# Patient Record
Sex: Female | Born: 1995
Health system: Southern US, Community
[De-identification: ages and names within clinical notes are randomized; demographics above are authoritative.]

## PROBLEM LIST (undated history)

## (undated) DIAGNOSIS — J45909 Unspecified asthma, uncomplicated: Secondary | ICD-10-CM

## (undated) HISTORY — PX: NO PAST SURGERIES: SHX2092

## (undated) HISTORY — DX: Unspecified asthma, uncomplicated: J45.909

---
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
## 2017-09-24 ENCOUNTER — Encounter: Payer: Self-pay | Admitting: Obstetrics & Gynecology

## 2017-09-24 ENCOUNTER — Ambulatory Visit (INDEPENDENT_AMBULATORY_CARE_PROVIDER_SITE_OTHER): Payer: Self-pay | Admitting: Obstetrics & Gynecology

## 2017-09-24 VITALS — BP 110/70 | Ht 63.0 in | Wt 153.0 lb

## 2017-09-24 DIAGNOSIS — N926 Irregular menstruation, unspecified: Secondary | ICD-10-CM

## 2017-09-24 DIAGNOSIS — Z3201 Encounter for pregnancy test, result positive: Secondary | ICD-10-CM

## 2017-09-24 LAB — POCT URINE PREGNANCY: Preg Test, Ur: POSITIVE — AB

## 2017-09-24 NOTE — Progress Notes (Signed)
09/24/2017   Chief Complaint: Missed period  Transfer of Care Patient: no  History of Present Illness: [name] is a 22 y.o. G1P0 5955w2d based on Patient's last menstrual period was 08/11/2017. with an Estimated Date of Delivery: 05/18/18, with the above CC.   Her periods were: regular periods every 28 days She was using oral contraceptives (estrogen/progesterone) when she conceived.  She has Positive signs or symptoms of nausea/vomiting of pregnancy. She has Negative signs or symptoms of miscarriage or preterm labor She identifies Negative Zika risk factors for her and her partner On any different medications around the time she conceived/early pregnancy: Yes  History of varicella: Yes   ROS: A 12-point review of systems was performed and negative, except as stated in the above HPI.  OBGYN History: As per HPI. OB History  Gravida Para Term Preterm AB Living  1            SAB TAB Ectopic Multiple Live Births               # Outcome Date GA Lbr Len/2nd Weight Sex Delivery Anes PTL Lv  1 Current             Any issues with any prior pregnancies: not applicable Any prior children are healthy, doing well, without any problems or issues: not applicable History of pap smears: No. Last pap smear Never. Abnormal: not applicable History of STIs: No   Past Medical History: History reviewed. No pertinent past medical history.  Past Surgical History: History reviewed. No pertinent surgical history.  Family History:  Family History  Problem Relation Age of Onset  . Colon cancer Paternal Aunt    She denies any female cancers, bleeding or blood clotting disorders.  She denies any history of mental retardation, birth defects or genetic disorders in her or the FOB's history  Social History:  Social History   Socioeconomic History  . Marital status: Unknown    Spouse name: Not on file  . Number of children: Not on file  . Years of education: Not on file  . Highest education level:  Not on file  Occupational History  . Not on file  Social Needs  . Financial resource strain: Not on file  . Food insecurity:    Worry: Not on file    Inability: Not on file  . Transportation needs:    Medical: Not on file    Non-medical: Not on file  Tobacco Use  . Smoking status: Never Smoker  . Smokeless tobacco: Never Used  Substance and Sexual Activity  . Alcohol use: Never    Frequency: Never  . Drug use: Never  . Sexual activity: Yes  Lifestyle  . Physical activity:    Days per week: Not on file    Minutes per session: Not on file  . Stress: Not on file  Relationships  . Social connections:    Talks on phone: Not on file    Gets together: Not on file    Attends religious service: Not on file    Active member of club or organization: Not on file    Attends meetings of clubs or organizations: Not on file    Relationship status: Not on file  . Intimate partner violence:    Fear of current or ex partner: Not on file    Emotionally abused: Not on file    Physically abused: Not on file    Forced sexual activity: Not on file  Other Topics Concern  .  Not on file  Social History Narrative  . Not on file   Any pets in the household: no  Allergy: Allergies  Allergen Reactions  . Amoxicillin Hives    Current Outpatient Medications: No current outpatient medications on file.   Physical Exam:   BP 110/70   Ht 5\' 3"  (1.6 m)   Wt 153 lb (69.4 kg)   LMP 08/11/2017   BMI 27.10 kg/m  Body mass index is 27.1 kg/m. Constitutional: Well nourished, well developed female in no acute distress.  Neck:  Supple, normal appearance, and no thyromegaly  Cardiovascular: S1, S2 normal, no murmur, rub or gallop, regular rate and rhythm Respiratory:  Clear to auscultation bilateral. Normal respiratory effort Abdomen: positive bowel sounds and no masses, hernias; diffusely non tender to palpation, non distended Breasts: breasts appear normal, no suspicious masses, no skin or  nipple changes or axillary nodes. Neuro/Psych:  Normal mood and affect.  Skin:  Warm and dry.  Lymphatic:  No inguinal lymphadenopathy.   Pelvic exam: is not limited by body habitus EGBUS: within normal limits, Vagina: within normal limits and with no blood in the vault, Cervix: normal appearing cervix without discharge or lesions, closed/long/high, Uterus:  enlarged: 6 weeks, and Adnexa:  normal adnexa  Assessment: Madison Lee is a 22 y.o. G1P0 [redacted]w[redacted]d based on Patient's last menstrual period was 08/11/2017. with an Estimated Date of Delivery: 05/18/18,  for prenatal care.  Plan:  1) Avoid alcoholic beverages. 2) Patient encouraged not to smoke.  3) Discontinue the use of all non-medicinal drugs and chemicals.  4) Take prenatal vitamins daily.  5) Seatbelt use advised 6) Nutrition, food safety (fish, cheese advisories, and high nitrite foods) and exercise discussed. 7) Hospital and practice style delivering at [facility] discussed  8) Patient is asked about travel to areas at risk for the Zika virus, and counseled to avoid travel and exposure to mosquitoes or sexual partners who may have themselves been exposed to the virus. Testing is discussed, and will be ordered as appropriate.  9) Childbirth classes at [facility] advised 10) Genetic Screening, such as with 1st Trimester Screening, cell free fetal DNA, AFP testing, and Ultrasound, as well as with amniocentesis and CVS as appropriate, is discussed with patient. She plans to consider options of genetic testing this pregnancy. 11) Dating [place] 2 weeks  Problem list reviewed and updated.  Annamarie Major, MD, [facility], [facility] 09/24/2017  4:20 PM

## 2017-09-24 NOTE — Patient Instructions (Signed)
First Trimester of Pregnancy The first trimester of pregnancy is from week 1 until the end of week 13 (months 1 through 3). A week after a sperm fertilizes an egg, the egg will implant on the wall of the uterus. This embryo will begin to develop into a baby. Genes from you and your partner will form the baby. The female genes will determine whether the baby will be a boy or a girl. At 6-8 weeks, the eyes and face will be formed, and the heartbeat can be seen on ultrasound. At the end of 12 weeks, all the baby's organs will be formed. Now that you are pregnant, you will want to do everything you can to have a healthy baby. Two of the most important things are to get good prenatal care and to follow your health care provider's instructions. Prenatal care is all the medical care you receive before the baby's birth. This care will help prevent, find, and treat any problems during the pregnancy and childbirth. Body changes during your first trimester Your body goes through many changes during pregnancy. The changes vary from woman to woman.  You may gain or lose a couple of pounds at first.  You may feel sick to your stomach (nauseous) and you may throw up (vomit). If the vomiting is uncontrollable, call your health care provider.  You may tire easily.  You may develop headaches that can be relieved by medicines. All medicines should be approved by your health care provider.  You may urinate more often. Painful urination may mean you have a bladder infection.  You may develop heartburn as a result of your pregnancy.  You may develop constipation because certain hormones are causing the muscles that push stool through your intestines to slow down.  You may develop hemorrhoids or swollen veins (varicose veins).  Your breasts may begin to grow larger and become tender. Your nipples may stick out more, and the tissue that surrounds them (areola) may become darker.  Your gums may bleed and may be  sensitive to brushing and flossing.  Dark spots or blotches (chloasma, mask of pregnancy) may develop on your face. This will likely fade after the baby is born.  Your menstrual periods will stop.  You may have a loss of appetite.  You may develop cravings for certain kinds of food.  You may have changes in your emotions from day to day, such as being excited to be pregnant or being concerned that something may go wrong with the pregnancy and baby.  You may have more vivid and strange dreams.  You may have changes in your hair. These can include thickening of your hair, rapid growth, and changes in texture. Some women also have hair loss during or after pregnancy, or hair that feels dry or thin. Your hair will most likely return to normal after your baby is born.  What to expect at prenatal visits During a routine prenatal visit:  You will be weighed to make sure you and the baby are growing normally.  Your blood pressure will be taken.  Your abdomen will be measured to track your baby's growth.  The fetal heartbeat will be listened to between weeks 10 and 14 of your pregnancy.  Test results from any previous visits will be discussed.  Your health care provider may ask you:  How you are feeling.  If you are feeling the baby move.  If you have had any abnormal symptoms, such as leaking fluid, bleeding, severe headaches,   or abdominal cramping.  If you are using any tobacco products, including cigarettes, chewing tobacco, and electronic cigarettes.  If you have any questions.  Other tests that may be performed during your first trimester include:  Blood tests to find your blood type and to check for the presence of any previous infections. The tests will also be used to check for low iron levels (anemia) and protein on red blood cells (Rh antibodies). Depending on your risk factors, or if you previously had diabetes during pregnancy, you may have tests to check for high blood  sugar that affects pregnant women (gestational diabetes).  Urine tests to check for infections, diabetes, or protein in the urine.  An ultrasound to confirm the proper growth and development of the baby.  Fetal screens for spinal cord problems (spina bifida) and Down syndrome.  HIV (human immunodeficiency virus) testing. Routine prenatal testing includes screening for HIV, unless you choose not to have this test.  You may need other tests to make sure you and the baby are doing well.  Follow these instructions at home: Medicines  Follow your health care provider's instructions regarding medicine use. Specific medicines may be either safe or unsafe to take during pregnancy.  Take a prenatal vitamin that contains at least 600 micrograms (mcg) of folic acid.  If you develop constipation, try taking a stool softener if your health care provider approves. Eating and drinking  Eat a balanced diet that includes fresh fruits and vegetables, whole grains, good sources of protein such as meat, eggs, or tofu, and low-fat dairy. Your health care provider will help you determine the amount of weight gain that is right for you.  Avoid raw meat and uncooked cheese. These carry germs that can cause birth defects in the baby.  Eating four or five small meals rather than three large meals a day may help relieve nausea and vomiting. If you start to feel nauseous, eating a few soda crackers can be helpful. Drinking liquids between meals, instead of during meals, also seems to help ease nausea and vomiting.  Limit foods that are high in fat and processed sugars, such as fried and sweet foods.  To prevent constipation: ? Eat foods that are high in fiber, such as fresh fruits and vegetables, whole grains, and beans. ? Drink enough fluid to keep your urine clear or pale yellow. Activity  Exercise only as directed by your health care provider. Most women can continue their usual exercise routine during  pregnancy. Try to exercise for 30 minutes at least 5 days a week. Exercising will help you: ? Control your weight. ? Stay in shape. ? Be prepared for labor and delivery.  Experiencing pain or cramping in the lower abdomen or lower back is a good sign that you should stop exercising. Check with your health care provider before continuing with normal exercises.  Try to avoid standing for long periods of time. Move your legs often if you must stand in one place for a long time.  Avoid heavy lifting.  Wear low-heeled shoes and practice good posture.  You may continue to have sex unless your health care provider tells you not to. Relieving pain and discomfort  Wear a good support bra to relieve breast tenderness.  Take warm sitz baths to soothe any pain or discomfort caused by hemorrhoids. Use hemorrhoid cream if your health care provider approves.  Rest with your legs elevated if you have leg cramps or low back pain.  If you develop   varicose veins in your legs, wear support hose. Elevate your feet for 15 minutes, 3-4 times a day. Limit salt in your diet. Prenatal care  Schedule your prenatal visits by the twelfth week of pregnancy. They are usually scheduled monthly at first, then more often in the last 2 months before delivery.  Write down your questions. Take them to your prenatal visits.  Keep all your prenatal visits as told by your health care provider. This is important. Safety  Wear your seat belt at all times when driving.  Make a list of emergency phone numbers, including numbers for family, friends, the hospital, and police and fire departments. General instructions  Ask your health care provider for a referral to a local prenatal education class. Begin classes no later than the beginning of month 6 of your pregnancy.  Ask for help if you have counseling or nutritional needs during pregnancy. Your health care provider can offer advice or refer you to specialists for help  with various needs.  Do not use hot tubs, steam rooms, or saunas.  Do not douche or use tampons or scented sanitary pads.  Do not cross your legs for long periods of time.  Avoid cat litter boxes and soil used by cats. These carry germs that can cause birth defects in the baby and possibly loss of the fetus by miscarriage or stillbirth.  Avoid all smoking, herbs, alcohol, and medicines not prescribed by your health care provider. Chemicals in these products affect the formation and growth of the baby.  Do not use any products that contain nicotine or tobacco, such as cigarettes and e-cigarettes. If you need help quitting, ask your health care provider. You may receive counseling support and other resources to help you quit.  Schedule a dentist appointment. At home, brush your teeth with a soft toothbrush and be gentle when you floss. Contact a health care provider if:  You have dizziness.  You have mild pelvic cramps, pelvic pressure, or nagging pain in the abdominal area.  You have persistent nausea, vomiting, or diarrhea.  You have a bad smelling vaginal discharge.  You have pain when you urinate.  You notice increased swelling in your face, hands, legs, or ankles.  You are exposed to fifth disease or chickenpox.  You are exposed to German measles (rubella) and have never had it. Get help right away if:  You have a fever.  You are leaking fluid from your vagina.  You have spotting or bleeding from your vagina.  You have severe abdominal cramping or pain.  You have rapid weight gain or loss.  You vomit blood or material that looks like coffee grounds.  You develop a severe headache.  You have shortness of breath.  You have any kind of trauma, such as from a fall or a car accident. Summary  The first trimester of pregnancy is from week 1 until the end of week 13 (months 1 through 3).  Your body goes through many changes during pregnancy. The changes vary from  woman to woman.  You will have routine prenatal visits. During those visits, your health care provider will examine you, discuss any test results you may have, and talk with you about how you are feeling. This information is not intended to replace advice given to you by your health care provider. Make sure you discuss any questions you have with your health care provider. Document Released: 03/31/2001 Document Revised: 03/18/2016 Document Reviewed: 03/18/2016 Elsevier Interactive Patient Education  2018 [employer]vier   Inc.  

## 2017-09-25 LAB — RPR+RH+ABO+RUB AB+AB SCR+CB...
Antibody Screen: NEGATIVE
HEMATOCRIT: 38.7 % (ref 34.0–46.6)
HEP B S AG: NEGATIVE
HIV SCREEN 4TH GENERATION: NONREACTIVE
Hemoglobin: 12.6 g/dL (ref 11.1–15.9)
MCH: 28.7 pg (ref 26.6–33.0)
MCHC: 32.6 g/dL (ref 31.5–35.7)
MCV: 88 fL (ref 79–97)
Platelets: 266 10*3/uL (ref 150–450)
RBC: 4.39 x10E6/uL (ref 3.77–5.28)
RDW: 12.9 % (ref 12.3–15.4)
RPR: NONREACTIVE
RUBELLA: 3.25 {index} (ref >0.99)
Rh Factor: POSITIVE
Varicella zoster IgG: 218 index (ref >165)
WBC: 8.8 10*3/uL (ref 3.4–10.8)

## 2017-09-29 LAB — GC/CHLAMYDIA PROBE AMP
Chlamydia trachomatis, NAA: NEGATIVE
NEISSERIA GONORRHOEAE BY PCR: NEGATIVE

## 2017-09-29 LAB — DRUG SCREEN, URINE
Amphetamines, Urine: NEGATIVE ng/mL
BARBITURATE SCREEN URINE: NEGATIVE ng/mL
BENZODIAZEPINE QUANT UR: NEGATIVE ng/mL
CANNABINOID QUANT UR: NEGATIVE ng/mL
Cocaine (Metab.): NEGATIVE ng/mL
OPIATE QUANT UR: NEGATIVE ng/mL
PCP Quant, Ur: NEGATIVE ng/mL

## 2017-09-29 LAB — URINE CULTURE

## 2017-10-08 ENCOUNTER — Other Ambulatory Visit: Payer: BLUE CROSS/BLUE SHIELD

## 2017-10-08 ENCOUNTER — Encounter: Payer: BLUE CROSS/BLUE SHIELD | Admitting: Obstetrics & Gynecology

## 2017-10-18 ENCOUNTER — Other Ambulatory Visit: Payer: BLUE CROSS/BLUE SHIELD

## 2017-10-18 ENCOUNTER — Telehealth: Payer: Self-pay | Admitting: Obstetrics & Gynecology

## 2017-10-18 ENCOUNTER — Encounter: Payer: BLUE CROSS/BLUE SHIELD | Admitting: Advanced Practice Midwife

## 2017-10-18 DIAGNOSIS — N926 Irregular menstruation, unspecified: Secondary | ICD-10-CM

## 2017-10-18 NOTE — Telephone Encounter (Signed)
Done

## 2017-10-18 NOTE — Telephone Encounter (Signed)
Patient was schedule today for ultrasound. Due to having no ultrasound tech today we are having to reschedule her appointment to 10/26/17 with Ultrasound and ROB. The Ultrasound order runs out on 10/24/17. Would you please extend the ultrasound order please. Thank you

## 2017-10-26 ENCOUNTER — Ambulatory Visit (INDEPENDENT_AMBULATORY_CARE_PROVIDER_SITE_OTHER): Payer: BLUE CROSS/BLUE SHIELD | Admitting: Obstetrics and Gynecology

## 2017-10-26 ENCOUNTER — Encounter: Payer: Self-pay | Admitting: Obstetrics and Gynecology

## 2017-10-26 ENCOUNTER — Ambulatory Visit (INDEPENDENT_AMBULATORY_CARE_PROVIDER_SITE_OTHER): Payer: Medicaid Other

## 2017-10-26 VITALS — BP 124/74 | Wt 149.0 lb

## 2017-10-26 DIAGNOSIS — Z3401 Encounter for supervision of normal first pregnancy, first trimester: Secondary | ICD-10-CM

## 2017-10-26 DIAGNOSIS — Z3A10 10 weeks gestation of pregnancy: Secondary | ICD-10-CM

## 2017-10-26 DIAGNOSIS — Z3A11 11 weeks gestation of pregnancy: Secondary | ICD-10-CM | POA: Diagnosis not present

## 2017-10-26 DIAGNOSIS — N926 Irregular menstruation, unspecified: Secondary | ICD-10-CM

## 2017-10-26 DIAGNOSIS — O3680X0 Pregnancy with inconclusive fetal viability, not applicable or unspecified: Secondary | ICD-10-CM | POA: Diagnosis not present

## 2017-10-26 DIAGNOSIS — Z3400 Encounter for supervision of normal first pregnancy, unspecified trimester: Secondary | ICD-10-CM

## 2017-10-26 NOTE — Progress Notes (Signed)
ROB Dating scan 

## 2017-10-26 NOTE — Progress Notes (Signed)
    Routine Prenatal Care Visit  Subjective  Madison Lee is a 22 y.o. G1P0 at 9077w6d being seen today for ongoing prenatal care.  She is currently monitored for the following issues for this low-risk pregnancy and has Supervision of normal first pregnancy, antepartum on their problem list.  ----------------------------------------------------------------------------------- Patient reports no complaints.   Contractions: Not present. Vag. Bleeding: None.   . Denies leaking of fluid.  ----------------------------------------------------------------------------------- The following portions of the patient's history were reviewed and updated as appropriate: allergies, current medications, past family history, past medical history, past social history, past surgical history and problem list. Problem list updated.   Objective  Blood pressure 124/74, weight 149 lb (67.6 kg), last menstrual period 08/11/2017. Pregravid weight 150 lb (68 kg) Total Weight Gain -1 lb (-0.454 kg) Urinalysis:      Fetal Status: Fetal Heart Rate (bpm): 162         General:  Alert, oriented and cooperative. Patient is in no acute distress.  Skin: Skin is warm and dry. No rash noted.   Cardiovascular: Normal heart rate noted  Respiratory: Normal respiratory effort, no problems with respiration noted  Abdomen: Soft, gravid, appropriate for gestational age. Pain/Pressure: Absent     Pelvic:  Cervical exam deferred        Extremities: Normal range of motion.     ental Status: Normal mood and affect. Normal behavior. Normal judgment and thought content.     Assessment   22 y.o. G1P0 at 6177w6d by  05/18/2018, by Last Menstrual Period presenting for routine prenatal visit  Plan   [email or web address] Problems (from 09/10/17 to present)    No problems associated with this episode.       Gestational age appropriate obstetric precautions including but not limited to vaginal bleeding, contractions, leaking of fluid and fetal  movement were reviewed in detail with the patient.    Madison Lee is interested in the first trimester screen at this time. If her medicaid is approved she may decide to have Materniti21 testing.  Letter given regarding limiting RoundUp exposure during pregnancy.  Return in about 2 weeks (around 11/09/2017) for ROB and US.  Adelene Idlerhristanna Michelena Culmer MD [facility], [facility] 10/26/17 6:10 PM

## 2017-11-11 ENCOUNTER — Ambulatory Visit (INDEPENDENT_AMBULATORY_CARE_PROVIDER_SITE_OTHER): Payer: BLUE CROSS/BLUE SHIELD | Admitting: Advanced Practice Midwife

## 2017-11-11 ENCOUNTER — Ambulatory Visit (INDEPENDENT_AMBULATORY_CARE_PROVIDER_SITE_OTHER): Payer: BLUE CROSS/BLUE SHIELD

## 2017-11-11 VITALS — BP 116/72 | Wt 155.0 lb

## 2017-11-11 DIAGNOSIS — Z3401 Encounter for supervision of normal first pregnancy, first trimester: Secondary | ICD-10-CM

## 2017-11-11 DIAGNOSIS — Z1379 Encounter for other screening for genetic and chromosomal anomalies: Secondary | ICD-10-CM

## 2017-11-11 DIAGNOSIS — Z3400 Encounter for supervision of normal first pregnancy, unspecified trimester: Secondary | ICD-10-CM

## 2017-11-11 DIAGNOSIS — Z3A13 13 weeks gestation of pregnancy: Secondary | ICD-10-CM

## 2017-11-11 NOTE — Progress Notes (Signed)
ROB

## 2017-11-11 NOTE — Progress Notes (Signed)
  Routine Prenatal Care Visit  Subjective  Madison Lee is a 22 y.o. G1P0 at 437w1d being seen today for ongoing prenatal care.  She is currently monitored for the following issues for this low-risk pregnancy and has Supervision of normal first pregnancy, antepartum on their problem list.  ----------------------------------------------------------------------------------- Patient reports no complaints.  Discussion of prenatal care, food safety, labor questions.  . Vag. Bleeding: None.   . Denies leaking of fluid.  ----------------------------------------------------------------------------------- The following portions of the patient's history were reviewed and updated as appropriate: allergies, current medications, past family history, past medical history, past social history, past surgical history and problem list. Problem list updated.   Objective  Blood pressure 116/72, weight 155 lb (70.3 kg), last menstrual period 08/11/2017. Pregravid weight 150 lb (68 kg) Total Weight Gain 5 lb (2.268 kg) Urinalysis: Urine Protein: Negative Urine Glucose: Negative  Fetal Status: Fetal Heart Rate (bpm): 149         General:  Alert, oriented and cooperative. Patient is in no acute distress.  Skin: Skin is warm and dry. No rash noted.   Cardiovascular: Normal heart rate noted  Respiratory: Normal respiratory effort, no problems with respiration noted  Abdomen: Soft, gravid, appropriate for gestational age. Pain/Pressure: Absent     Pelvic:  Cervical exam deferred        Extremities: Normal range of motion.  Edema: None  Mental Status: Normal mood and affect. Normal behavior. Normal judgment and thought content.   Assessment   22 y.o. G1P0 at 807w1d by  05/18/2018, by Last Menstrual Period presenting for routine prenatal visit  Plan   pregnancy1 Problems (from 09/10/17 to present)    No problems associated with this episode.       Preterm labor symptoms and general obstetric precautions  including but not limited to vaginal bleeding, contractions, leaking of fluid and fetal movement were reviewed in detail with the patient. Please refer to After Visit Summary for other counseling recommendations.   Return in about 1 month (around 12/09/2017) for rob.  Tresea MallJane Dayvian Lee, CNM 11/11/2017 4:14 PM

## 2017-11-11 NOTE — Patient Instructions (Signed)
Prenatal Care WHAT IS PRENATAL CARE? Prenatal care is the process of caring for a pregnant woman before she gives birth. Prenatal care makes sure that she and her baby remain as healthy as possible throughout pregnancy. Prenatal care may be provided by a midwife, family practice health care provider, or a childbirth and pregnancy specialist (obstetrician). Prenatal care may include physical examinations, testing, treatments, and education on nutrition, lifestyle, and social support services. WHY IS PRENATAL CARE SO IMPORTANT? Early and consistent prenatal care increases the chance that you and your baby will remain healthy throughout your pregnancy. This type of care also decreases a baby's risk of being born too early (prematurely), or being born smaller than expected (small for gestational age). Any underlying medical conditions you may have that could pose a risk during your pregnancy are discussed during prenatal care visits. You will also be monitored regularly for any new conditions that may arise during your pregnancy so they can be treated quickly and effectively. WHAT HAPPENS DURING PRENATAL CARE VISITS? Prenatal care visits may include the following: Discussion Tell your health care provider about any new signs or symptoms you have experienced since your last visit. These might include:  Nausea or vomiting.  Increased or decreased level of energy.  Difficulty sleeping.  Back or leg pain.  Weight changes.  Frequent urination.  Shortness of breath with physical activity.  Changes in your skin, such as the development of a rash or itchiness.  Vaginal discharge or bleeding.  Feelings of excitement or nervousness.  Changes in your baby's movements.  You may want to write down any questions or topics you want to discuss with your health care provider and bring them with you to your appointment. Examination During your first prenatal care visit, you will likely have a complete  physical exam. Your health care provider will often examine your vagina, cervix, and the position of your uterus, as well as check your heart, lungs, and other body systems. As your pregnancy progresses, your health care provider will measure the size of your uterus and your baby's position inside your uterus. He or she may also examine you for early signs of labor. Your prenatal visits may also include checking your blood pressure and, after about 10-12 weeks of pregnancy, listening to your baby's heartbeat. Testing Regular testing often includes:  Urinalysis. This checks your urine for glucose, protein, or signs of infection.  Blood count. This checks the levels of white and red blood cells in your body.  Tests for sexually transmitted infections (STIs). Testing for STIs at the beginning of pregnancy is routinely done and is required in many states.  Antibody testing. You will be checked to see if you are immune to certain illnesses, such as rubella, that can affect a developing fetus.  Glucose screen. Around 24-28 weeks of pregnancy, your blood glucose level will be checked for signs of gestational diabetes. Follow-up tests may be recommended.  Group B strep. This is a bacteria that is commonly found inside a woman's vagina. This test will inform your health care provider if you need an antibiotic to reduce the amount of this bacteria in your body prior to labor and childbirth.  Ultrasound. Many pregnant women undergo an ultrasound screening around 18-20 weeks of pregnancy to evaluate the health of the fetus and check for any developmental abnormalities.  HIV (human immunodeficiency virus) testing. Early in your pregnancy, you will be screened for HIV. If you are at high risk for HIV, this test may   be repeated during your third trimester of pregnancy.  You may be offered other testing based on your age, personal or family medical history, or other factors. HOW OFTEN SHOULD I PLAN TO SEE MY  HEALTH CARE PROVIDER FOR PRENATAL CARE? Your prenatal care check-up schedule depends on any medical conditions you have before, or develop during, your pregnancy. If you do not have any underlying medical conditions, you will likely be seen for checkups:  Monthly, during the first 6 months of pregnancy.  Twice a month during months 7 and 8 of pregnancy.  Weekly starting in the 9th month of pregnancy and until delivery.  If you develop signs of early labor or other concerning signs or symptoms, you may need to see your health care provider more often. Ask your health care provider what prenatal care schedule is best for you. WHAT CAN I DO TO KEEP MYSELF AND MY BABY AS HEALTHY AS POSSIBLE DURING MY PREGNANCY?  Take a prenatal vitamin containing 400 micrograms (0.4 mg) of folic acid every day. Your health care provider may also ask you to take additional vitamins such as iodine, vitamin D, iron, copper, and zinc.  Take 1500-2000 mg of calcium daily starting at your 20th week of pregnancy until you deliver your baby.  Make sure you are up to date on your vaccinations. Unless directed otherwise by your health care provider: ? You should receive a tetanus, diphtheria, and pertussis (Tdap) vaccination between the 27th and 36th week of your pregnancy, regardless of when your last Tdap immunization occurred. This helps protect your baby from whooping cough (pertussis) after he or she is born. ? You should receive an annual inactivated influenza vaccine (IIV) to help protect you and your baby from influenza. This can be done at any point during your pregnancy.  Eat a well-rounded diet that includes: ? Fresh fruits and vegetables. ? Lean proteins. ? Calcium-rich foods such as milk, yogurt, hard cheeses, and dark, leafy greens. ? Whole grain breads.  Do noteat seafood high in mercury, including: ? Swordfish. ? Tilefish. ? Shark. ? King mackerel. ? More than 6 oz tuna per week.  Do not  eat: ? Raw or undercooked meats or eggs. ? Unpasteurized foods, such as soft cheeses (brie, blue, or feta), juices, and milks. ? Lunch meats. ? Hot dogs that have not been heated until they are steaming.  Drink enough water to keep your urine clear or pale yellow. For many women, this may be 10 or more 8 oz glasses of water each day. Keeping yourself hydrated helps deliver nutrients to your baby and may prevent the start of pre-term uterine contractions.  Do not use any tobacco products including cigarettes, chewing tobacco, or electronic cigarettes. If you need help quitting, ask your health care provider.  Do not drink beverages containing alcohol. No safe level of alcohol consumption during pregnancy has been determined.  Do not use any illegal drugs. These can harm your developing baby or cause a miscarriage.  Ask your health care provider or pharmacist before taking any prescription or over-the-counter medicines, herbs, or supplements.  Limit your caffeine intake to no more than 200 mg per day.  Exercise. Unless told otherwise by your health care provider, try to get 30 minutes of moderate exercise most days of the week. Do not  do high-impact activities, contact sports, or activities with a high risk of falling, such as horseback riding or downhill skiing.  Get plenty of rest.  Avoid anything that raises your  body temperature, such as hot tubs and saunas.  If you own a cat, do not empty its litter box. Bacteria contained in cat feces can cause an infection called toxoplasmosis. This can result in serious harm to the fetus.  Stay away from chemicals such as insecticides, lead, mercury, and cleaning or paint products that contain solvents.  Do not have any X-rays taken unless medically necessary.  Take a childbirth and breastfeeding preparation class. Ask your health care provider if you need a referral or recommendation.  This information is not intended to replace advice given  to you by your health care provider. Make sure you discuss any questions you have with your health care provider. Document Released: 04/09/2003 Document Revised: 09/09/2015 Document Reviewed: 06/21/2013 Elsevier Interactive Patient Education  2017 [employer] Eating Plan for Pregnant Women While you are pregnant, your body will require additional nutrition to help support your growing baby. It is recommended that you consume:  150 additional calories each day during your first trimester.  300 additional calories each day during your second trimester.  300 additional calories each day during your third trimester.  Eating a healthy, well-balanced diet is very important for your health and for your baby's health. You also have a higher need for some vitamins and minerals, such as folic acid, calcium, iron, and vitamin D. What do I need to know about eating during pregnancy?  Do not try to lose weight or go on a diet during pregnancy.  Choose healthy, nutritious foods. Choose  of a sandwich with a glass of milk instead of a candy bar or a high-calorie sugar-sweetened beverage.  Limit your overall intake of foods that have "empty calories." These are foods that have little nutritional value, such as sweets, desserts, candies, sugar-sweetened beverages, and fried foods.  Eat a variety of foods, especially fruits and vegetables.  Take a prenatal vitamin to help meet the additional needs during pregnancy, specifically for folic acid, iron, calcium, and vitamin D.  Remember to stay active. Ask your health care provider for exercise recommendations that are specific to you.  Practice good food safety and cleanliness, such as washing your hands before you eat and after you prepare raw meat. This helps to prevent foodborne illnesses, such as listeriosis, that can be very dangerous for your baby. Ask your health care provider for more information about listeriosis. What does 150 extra calories  look like? Healthy options for an additional 150 calories each day could be any of the following:  Plain low-fat yogurt (6-8 oz) with  cup of berries.  1 apple with 2 teaspoons of peanut butter.  Cut-up vegetables with  cup of hummus.  Low-fat chocolate milk (8 oz or 1 cup).  1 string cheese with 1 medium orange.   of a peanut butter and jelly sandwich on whole-wheat bread (1 tsp of peanut butter).  For 300 calories, you could eat two of those healthy options each day. What is a healthy amount of weight to gain? The recommended amount of weight for you to gain is based on your pre-pregnancy BMI. If your pre-pregnancy BMI was:  Less than 18 (underweight), you should gain 28-40 lb.  18-24.9 (normal), you should gain 25-35 lb.  25-29.9 (overweight), you should gain 15-25 lb.  Greater than 30 (obese), you should gain 11-20 lb.  What if I am having twins or multiples? Generally, pregnant women who will be having twins or multiples may need to increase their daily calories by 300-600 calories each   day. The recommended range for total weight gain is 25-54 lb, depending on your pre-pregnancy BMI. Talk with your health care provider for specific guidance about additional nutritional needs, weight gain, and exercise during your pregnancy. What foods can I eat? Grains Any grains. Try to choose whole grains, such as whole-wheat bread, oatmeal, or brown rice. Vegetables Any vegetables. Try to eat a variety of colors and types of vegetables to get a full range of vitamins and minerals. Remember to wash your vegetables well before eating. Fruits Any fruits. Try to eat a variety of colors and types of fruit to get a full range of vitamins and minerals. Remember to wash your fruits well before eating. Meats and Other Protein Sources Lean meats, including chicken, turkey, fish, and lean cuts of beef, veal, or pork. Make sure that all meats are cooked to "well done." Tofu. Tempeh. Beans. Eggs.  Peanut butter and other nut butters. Seafood, such as shrimp, crab, and lobster. If you choose fish, select types that are higher in omega-3 fatty acids, including salmon, herring, mussels, trout, sardines, and pollock. Make sure that all meats are cooked to food-safe temperatures. Dairy Pasteurized milk and milk alternatives. Pasteurized yogurt and pasteurized cheese. Cottage cheese. Sour cream. Beverages Water. Juices that contain 100% fruit juice or vegetable juice. Caffeine-free teas and decaffeinated coffee. Drinks that contain caffeine are okay to drink, but it is better to avoid caffeine. Keep your total caffeine intake to less than 200 mg each day (12 oz of coffee, tea, or soda) or as directed by your health care provider. Condiments Any pasteurized condiments. Sweets and Desserts Any sweets and desserts. Fats and Oils Any fats and oils. The items listed above may not be a complete list of recommended foods or beverages. Contact your dietitian for more options. What foods are not recommended? Vegetables Unpasteurized (raw) vegetable juices. Fruits Unpasteurized (raw) fruit juices. Meats and Other Protein Sources Cured meats that have nitrates, such as bacon, salami, and hotdogs. Luncheon meats, bologna, or other deli meats (unless they are reheated until they are steaming hot). Refrigerated pate, meat spreads from a meat counter, smoked seafood that is found in the refrigerated section of a store. Raw fish, such as sushi or sashimi. High mercury content fish, such as tilefish, shark, swordfish, and king mackerel. Raw meats, such as tuna or beef tartare. Undercooked meats and poultry. Make sure that all meats are cooked to food-safe temperatures. Dairy Unpasteurized (raw) milk and any foods that have raw milk in them. Soft cheeses, such as feta, queso blanco, queso fresco, Brie, Camembert cheeses, blue-veined cheeses, and Panela cheese (unless it is made with pasteurized milk, which must  be stated on the label). Beverages Alcohol. Sugar-sweetened beverages, such as sodas, teas, or energy drinks. Condiments Homemade fermented foods and drinks, such as pickles, sauerkraut, or kombucha drinks. (Store-bought pasteurized versions of these are okay.) Other Salads that are made in the store, such as ham salad, chicken salad, egg salad, tuna salad, and seafood salad. The items listed above may not be a complete list of foods and beverages to avoid. Contact your dietitian for more information. This information is not intended to replace advice given to you by your health care provider. Make sure you discuss any questions you have with your health care provider. Document Released: 01/19/2014 Document Revised: 09/12/2015 Document Reviewed: 09/19/2013 Elsevier Interactive Patient Education  2018 [employer] Inc[employer]  

## 2017-11-13 LAB — FIRST TRIMESTER SCREEN W/NT
CRL: 72.4 mm
DIA MoM: 1.28
DIA Value: 277.5 pg/mL
Gest Age-Collect: 13.1 weeks
HCG MOM: 0.83
HCG VALUE: 68.4 [IU]/mL
Maternal Age At EDD: 21.6 yr
NUCHAL TRANSLUCENCY MOM: 1.6
NUCHAL TRANSLUCENCY: 2.4 mm
Number of Fetuses: 1
PAPP-A MOM: 1.42
PAPP-A VALUE: 1580.2 ng/mL
Test Results:: NEGATIVE
WEIGHT: 155 [lb_av]

## 2017-11-15 ENCOUNTER — Encounter: Payer: Self-pay | Admitting: Medical

## 2017-11-15 ENCOUNTER — Ambulatory Visit: Payer: BLUE CROSS/BLUE SHIELD | Admitting: Medical

## 2017-11-15 VITALS — BP 124/68 | HR 86 | Temp 98.7°F | Resp 16 | Wt 147.6 lb

## 2017-11-15 DIAGNOSIS — B349 Viral infection, unspecified: Secondary | ICD-10-CM

## 2017-11-15 DIAGNOSIS — R0982 Postnasal drip: Secondary | ICD-10-CM

## 2017-11-15 MED ORDER — AZITHROMYCIN 250 MG PO TABS: ORAL_TABLET | ORAL | 0 refills | Status: DC

## 2017-11-15 NOTE — Patient Instructions (Addendum)
OTC Claritin take as directed.  Rest and increase fluids. Start anbiotics in 2 days if worsening.   Postnasal Drip Postnasal drip is the feeling of mucus going down the back of your throat. Mucus is a slimy substance that moistens and cleans your nose and throat, as well as the air pockets in face bones near your forehead and cheeks (sinuses). Small amounts of mucus pass from your nose and sinuses down the back of your throat all the time. This is normal. When you produce too much mucus or the mucus gets too thick, you can feel it. Some common causes of postnasal drip include:  Having more mucus because of: ? A cold or the flu. ? Allergies. ? Cold air. ? Certain medicines.  Having more mucus that is thicker because of: ? A sinus or nasal infection. ? Dry air. ? A food allergy.  Follow these instructions at home: Relieving discomfort  Gargle with a salt-water mixture 3-4 times a day or as needed. To make a salt-water mixture, completely dissolve -1 tsp of salt in 1 cup of warm water.  If the air in your home is dry, use a humidifier to add moisture to the air.  Use a saline spray or container (neti pot) to flush out the nose (nasal irrigation). These methods can help clear away mucus and keep the nasal passages moist. General instructions  Take over-the-counter and prescription medicines only as told by your health care provider.  Follow instructions from your health care provider about eating or drinking restrictions. You may need to avoid caffeine.  Avoid things that you know you are allergic to (allergens), like dust, mold, pollen, pets, or certain foods.  Drink enough fluid to keep your urine pale yellow.  Keep all follow-up visits as told by your health care provider. This is important. Contact a health care provider if:  You have a fever.  You have a sore throat.  You have difficulty swallowing.  You have headache.  You have sinus pain.  You have a cough that  does not go away.  The mucus from your nose becomes thick and is green or yellow in color.  You have cold or flu symptoms that last more than 10 days. Summary  Postnasal drip is the feeling of mucus going down the back of your throat.  If your health care provider approves, use nasal irrigation or a nasal spray 2?4 times a day.  Avoid things that you know you are allergic to (allergens), like dust, mold, pollen, pets, or certain foods. This information is not intended to replace advice given to you by your health care provider. Make sure you discuss any questions you have with your health care provider. Document Released: 07/20/2016 Document Revised: 07/20/2016 Document Reviewed: 07/20/2016 Elsevier Interactive Patient Education  2018 [employer] Viral Illness, Adult Viruses are tiny germs that can get into a person's body and cause illness. There are many different types of viruses, and they cause many types of illness. Viral illnesses can range from mild to severe. They can affect various parts of the body. Common illnesses that are caused by a virus include colds and the flu. Viral illnesses also include serious conditions such as HIV/AIDS (human immunodeficiency virus/acquired immunodeficiency syndrome). A few viruses have been linked to certain cancers. What are the causes? Many types of viruses can cause illness. Viruses invade cells in your body, multiply, and cause the infected cells to malfunction or die. When the cell dies, it releases more  of the virus. When this happens, you develop symptoms of the illness, and the virus continues to spread to other cells. If the virus takes over the function of the cell, it can cause the cell to divide and grow out of control, as is the case when a virus causes cancer. Different viruses get into the body in different ways. You can get a virus by:  Swallowing food or water that is contaminated with the virus.  Breathing in droplets that have  been coughed or sneezed into the air by an infected person.  Touching a surface that has been contaminated with the virus and then touching your eyes, nose, or mouth.  Being bitten by an insect or animal that carries the virus.  Having sexual contact with a person who is infected with the virus.  Being exposed to blood or fluids that contain the virus, either through an open cut or during a transfusion.  If a virus enters your body, your body's defense system (immune system) will try to fight the virus. You may be at higher risk for a viral illness if your immune system is weak. What are the signs or symptoms? Symptoms vary depending on the type of virus and the location of the cells that it invades. Common symptoms of the main types of viral illnesses include: Cold and flu viruses  Fever.  Headache.  Sore throat.  Muscle aches.  Nasal congestion.  Cough. Digestive system (gastrointestinal) viruses  Fever.  Abdominal pain.  Nausea.  Diarrhea. Liver viruses (hepatitis)  Loss of appetite.  Tiredness.  Yellowing of the skin (jaundice). Brain and spinal cord viruses  Fever.  Headache.  Stiff neck.  Nausea and vomiting.  Confusion or sleepiness. Skin viruses  Warts.  Itching.  Rash. Sexually transmitted viruses  Discharge.  Swelling.  Redness.  Rash. How is this treated? Viruses can be difficult to treat because they live within cells. Antibiotic medicines do not treat viruses because these drugs do not get inside cells. Treatment for a viral illness may include:  Resting and drinking plenty of fluids.  Medicines to relieve symptoms. These can include over-the-counter medicine for pain and fever, medicines for cough or congestion, and medicines to relieve diarrhea.  Antiviral medicines. These drugs are available only for certain types of viruses. They may help reduce flu symptoms if taken early. There are also many antiviral medicines for  hepatitis and HIV/AIDS.  Some viral illnesses can be prevented with vaccinations. A common example is the flu shot. Follow these instructions at home: Medicines   Take over-the-counter and prescription medicines only as told by your health care provider.  If you were prescribed an antiviral medicine, take it as told by your health care provider. Do not stop taking the medicine even if you start to feel better.  Be aware of when antibiotics are needed and when they are not needed. Antibiotics do not treat viruses. If your health care provider thinks that you may have a bacterial infection as well as a viral infection, you may get an antibiotic. ? Do not ask for an antibiotic prescription if you have been diagnosed with a viral illness. That will not make your illness go away faster. ? Frequently taking antibiotics when they are not needed can lead to antibiotic resistance. When this develops, the medicine no longer works against the bacteria that it normally fights. General instructions  Drink enough fluids to keep your urine clear or pale yellow.  Rest as much as possible.  Return to your normal activities as told by your health care provider. Ask your health care provider what activities are safe for you.  Keep all follow-up visits as told by your health care provider. This is important. How is this prevented? Take these actions to reduce your risk of viral infection:  Eat a healthy diet and get enough rest.  Wash your hands often with soap and water. This is especially important when you are in public places. If soap and water are not available, use hand sanitizer.  Avoid close contact with friends and family who have a viral illness.  If you travel to areas where viral gastrointestinal infection is common, avoid drinking water or eating raw food.  Keep your immunizations up to date. Get a flu shot every year as told by your health care provider.  Do not share toothbrushes, nail  clippers, razors, or needles with other people.  Always practice safe sex.  Contact a health care provider if:  You have symptoms of a viral illness that do not go away.  Your symptoms come back after going away.  Your symptoms get worse. Get help right away if:  You have trouble breathing.  You have a severe headache or a stiff neck.  You have severe vomiting or abdominal pain. This information is not intended to replace advice given to you by your health care provider. Make sure you discuss any questions you have with your health care provider. Document Released: 08/16/2015 Document Revised: 09/18/2015 Document Reviewed: 08/16/2015 Elsevier Interactive Patient Education  [employer].

## 2017-11-15 NOTE — Progress Notes (Signed)
   Subjective:    Patient ID: Madison Lee, female    DOB: 10/13/1995, 22 y.o.   MRN: [id number]  HPI 22 yo female in non acute distress. Comes in today with complaints of , Starting Sunday morning with headache left side 4/10 took Tylenol at  5:30am , with miniimal relief, feels like it is moving down her throat. Feels raspy in throat.  4 years ago was placed on Inhaler for  Cold weather induced asthma with exercise. Marland Kitchen. Has a dry cough now.  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  Is  13-[redacted] weeks pregnant.  Allergies  Allergen Reactions  . Amoxicillin Hives   Blood pressure 124/68, pulse 86, temperature 98.7 F (37.1 C), temperature source Tympanic, resp. rate 16, weight 147 lb 9.6 oz (67 kg), last menstrual period 08/11/2017, SpO2 100 %. Review of Systems  Constitutional: Negative for chills and fever.  HENT: Positive for congestion, ear pain (left side), postnasal drip, sinus pressure, sinus pain, sore throat and voice change.   Eyes: Negative for discharge and itching.  Respiratory: Positive for cough (dry). Negative for shortness of breath and wheezing.   Cardiovascular: Negative for chest pain.  Gastrointestinal: Negative for abdominal pain.  Endocrine: Negative for polydipsia, polyphagia and polyuria.  Musculoskeletal: Negative for myalgias.  Skin: Negative for rash.  Allergic/Immunologic: Positive for environmental allergies.  Neurological: Negative for dizziness, syncope and light-headedness.  Hematological: Positive for adenopathy (" the beginning of something").  Psychiatric/Behavioral: Negative for behavioral problems, self-injury and suicidal ideas.       Objective:   Physical Exam  Constitutional: She appears well-developed and well-nourished.  HENT:  Head: Normocephalic and atraumatic.  Right Ear: Hearing and ear canal normal.  Left Ear: Hearing and ear canal normal. A middle ear effusion is present.  Mouth/Throat: Uvula is midline, oropharynx is clear  and moist and mucous membranes are normal. Tonsils are 0 on the right. Tonsils are 0 on the left.  Eyes: Pupils are equal, round, and reactive to light. EOM are normal.  Neck: Normal range of motion. Neck supple.  Cardiovascular: Normal rate, regular rhythm and normal heart sounds.  Pulmonary/Chest: Effort normal and breath sounds normal. No respiratory distress. She has no wheezes. She has no rhonchi. She has no rales.  Abdominal: Soft. Bowel sounds are normal.  Lymphadenopathy:    She has no cervical adenopathy.  Neurological: She is alert.  Skin: Skin is warm and dry.  Psychiatric: She has a normal mood and affect. Her behavior is normal.  Nursing note and vitals reviewed.         Assessment & Plan:  Viral illness, PND  OTC Claritin take as directed. OTC Tylenol as needed. Rest and increase fluids. Start anbiotics in 2 days if worsening.  Meds ordered this encounter  Medications  . azithromycin (ZITHROMAX) 250 MG tablet    Sig: Take  2 tablets by mouth today then one tablet days 2-5, take food.    Dispense:  6 tablet    Refill:  0   if needed to start antibiotics should take with food. And should be feeling better in 3-5 days , if not needs to follow up with OB/GYN or this clinic, Patient verbalizes understanding and has no questions at discharge.

## 2017-12-09 ENCOUNTER — Ambulatory Visit (INDEPENDENT_AMBULATORY_CARE_PROVIDER_SITE_OTHER): Payer: BLUE CROSS/BLUE SHIELD | Admitting: Obstetrics & Gynecology

## 2017-12-09 VITALS — BP 110/70 | Wt 148.0 lb

## 2017-12-09 DIAGNOSIS — Z3400 Encounter for supervision of normal first pregnancy, unspecified trimester: Secondary | ICD-10-CM

## 2017-12-09 DIAGNOSIS — Z3402 Encounter for supervision of normal first pregnancy, second trimester: Secondary | ICD-10-CM

## 2017-12-09 DIAGNOSIS — Z3A17 17 weeks gestation of pregnancy: Secondary | ICD-10-CM

## 2017-12-09 LAB — POCT URINALYSIS DIPSTICK OB
GLUCOSE, UA: NEGATIVE — AB
POC,PROTEIN,UA: NEGATIVE

## 2017-12-09 NOTE — Patient Instructions (Signed)

## 2017-12-09 NOTE — Addendum Note (Signed)
Addended by: Cornelius MorasPATTERSON, Sammuel Blick D on: 12/09/2017 04:32 PM   Modules accepted: Orders

## 2017-12-09 NOTE — Progress Notes (Signed)
  Subjective  Fetal Movement? yes Contractions? no Leaking Fluid? no Vaginal Bleeding? no  Objective  BP 110/70   Wt 148 lb (67.1 kg)   LMP 08/11/2017   BMI 26.22 kg/m  General: NAD Pumonary: no increased work of breathing Abdomen: gravid, non-tender Extremities: no edema Psychiatric: mood appropriate, affect full  Assessment  22 y.o. G1P0 at 4435w1d by  05/18/2018, by Last Menstrual Period presenting for routine prenatal visit  Plan   Problem List Items Addressed This Visit      Other   Supervision of normal first pregnancy, antepartum   Relevant Orders   US OB Comp + 14 Wk    Other Visit Diagnoses    [redacted] weeks gestation of pregnancy    -  Primary   Relevant Orders   US OB Comp + 14 Wk    Declines flu shot.  Info gv, may reconsider  Annamarie MajorPaul Harris, MD, Merlinda FrederickFACOG [facility], [facility] 12/09/2017  4:20 PM

## 2017-12-30 ENCOUNTER — Ambulatory Visit (INDEPENDENT_AMBULATORY_CARE_PROVIDER_SITE_OTHER): Payer: BLUE CROSS/BLUE SHIELD | Admitting: Obstetrics and Gynecology

## 2017-12-30 ENCOUNTER — Encounter: Payer: Self-pay | Admitting: Obstetrics and Gynecology

## 2017-12-30 ENCOUNTER — Ambulatory Visit (INDEPENDENT_AMBULATORY_CARE_PROVIDER_SITE_OTHER): Payer: BLUE CROSS/BLUE SHIELD

## 2017-12-30 VITALS — BP 112/72 | Wt 149.0 lb

## 2017-12-30 DIAGNOSIS — Z3A17 17 weeks gestation of pregnancy: Secondary | ICD-10-CM

## 2017-12-30 DIAGNOSIS — Z3A20 20 weeks gestation of pregnancy: Secondary | ICD-10-CM

## 2017-12-30 DIAGNOSIS — Z23 Encounter for immunization: Secondary | ICD-10-CM

## 2017-12-30 DIAGNOSIS — Z3402 Encounter for supervision of normal first pregnancy, second trimester: Secondary | ICD-10-CM

## 2017-12-30 DIAGNOSIS — Z3400 Encounter for supervision of normal first pregnancy, unspecified trimester: Secondary | ICD-10-CM

## 2017-12-30 DIAGNOSIS — Z363 Encounter for antenatal screening for malformations: Secondary | ICD-10-CM | POA: Diagnosis not present

## 2017-12-30 LAB — POCT URINALYSIS DIPSTICK OB
Glucose, UA: NEGATIVE
POC,PROTEIN,UA: NEGATIVE

## 2017-12-30 NOTE — Progress Notes (Signed)
ROB/U/S No concerns Flu shot today

## 2017-12-30 NOTE — Progress Notes (Signed)
    Routine Prenatal Care Visit  Subjective  Madison Lee is a 22 y.o. G1P0 at 684w1d being seen today for ongoing prenatal care.  She is currently monitored for the following issues for this low-risk pregnancy and has Supervision of normal first pregnancy, antepartum on their problem list.  ----------------------------------------------------------------------------------- Patient reports no complaints.   Contractions: Not present. Vag. Bleeding: None.  Movement: Present. Denies leaking of fluid.  ----------------------------------------------------------------------------------- The following portions of the patient's history were reviewed and updated as appropriate: allergies, current medications, past family history, past medical history, past social history, past surgical history and problem list. Problem list updated.   Objective  Blood pressure 112/72, weight 149 lb (67.6 kg), last menstrual period 08/11/2017. Pregravid weight 150 lb (68 kg) Total Weight Gain -1 lb (-0.454 kg) Urinalysis:      Fetal Status: Fetal Heart Rate (bpm): 144   Movement: Present     General:  Alert, oriented and cooperative. Patient is in no acute distress.  Skin: Skin is warm and dry. No rash noted.   Cardiovascular: Normal heart rate noted  Respiratory: Normal respiratory effort, no problems with respiration noted  Abdomen: Soft, gravid, appropriate for gestational age. Pain/Pressure: Absent     Pelvic:  Cervical exam deferred        Extremities: Normal range of motion.  Edema: None  ental Status: Normal mood and affect. Normal behavior. Normal judgment and thought content.     Assessment   22 y.o. G1P0 at 2784w1d by  05/18/2018, by Last Menstrual Period presenting for routine prenatal visit  Plan   pregnancy1 Problems (from 09/10/17 to present)    No problems associated with this episode.       Gestational age appropriate obstetric precautions including but not limited to vaginal bleeding,  contractions, leaking of fluid and fetal movement were reviewed in detail with the patient.    Incomplete US, repeat at next visit.   Return in about 4 weeks (around 01/27/2018) for ROB and US.  Adelene Idlerhristanna Schuman MD [facility], [facility] 12/30/17 8:37 PM

## 2018-01-27 ENCOUNTER — Ambulatory Visit (INDEPENDENT_AMBULATORY_CARE_PROVIDER_SITE_OTHER): Payer: BLUE CROSS/BLUE SHIELD

## 2018-01-27 ENCOUNTER — Ambulatory Visit (INDEPENDENT_AMBULATORY_CARE_PROVIDER_SITE_OTHER): Payer: BLUE CROSS/BLUE SHIELD | Admitting: Obstetrics and Gynecology

## 2018-01-27 VITALS — BP 110/76 | Wt 157.0 lb

## 2018-01-27 DIAGNOSIS — Z3402 Encounter for supervision of normal first pregnancy, second trimester: Secondary | ICD-10-CM

## 2018-01-27 DIAGNOSIS — Z3A20 20 weeks gestation of pregnancy: Secondary | ICD-10-CM

## 2018-01-27 DIAGNOSIS — Z3A24 24 weeks gestation of pregnancy: Secondary | ICD-10-CM

## 2018-01-27 DIAGNOSIS — Z362 Encounter for other antenatal screening follow-up: Secondary | ICD-10-CM | POA: Diagnosis not present

## 2018-01-27 DIAGNOSIS — Z3400 Encounter for supervision of normal first pregnancy, unspecified trimester: Secondary | ICD-10-CM

## 2018-01-27 LAB — POCT URINALYSIS DIPSTICK OB
Glucose, UA: NEGATIVE
PROTEIN: NEGATIVE

## 2018-01-27 NOTE — Progress Notes (Signed)
ROB Ultrasound follow up/ It is a GIRL!!

## 2018-01-28 NOTE — Progress Notes (Signed)
Routine Prenatal Care Visit  Subjective  Madison Lee is a 22 y.o. G1P0 at [redacted]w[redacted]d being seen today for ongoing prenatal care.  She is currently monitored for the following issues for this low-risk pregnancy and has Supervision of normal first pregnancy, antepartum on their problem list.  ----------------------------------------------------------------------------------- Patient reports no complaints.   Contractions: Not present. Vag. Bleeding: None.  Movement: Present. Denies leaking of fluid.  ----------------------------------------------------------------------------------- The following portions of the patient's history were reviewed and updated as appropriate: allergies, current medications, past family history, past medical history, past social history, past surgical history and problem list. Problem list updated.   Objective  Blood pressure 110/76, weight 157 lb (71.2 kg), last menstrual period 08/11/2017. Pregravid weight 150 lb (68 kg) Total Weight Gain 7 lb (3.175 kg) Urinalysis:      Fetal Status: Fetal Heart Rate (bpm): 145 Fundal Height: 24 cm Movement: Present     General:  Alert, oriented and cooperative. Patient is in no acute distress.  Skin: Skin is warm and dry. No rash noted.   Cardiovascular: Normal heart rate noted  Respiratory: Normal respiratory effort, no problems with respiration noted  Abdomen: Soft, gravid, appropriate for gestational age. Pain/Pressure: Absent     Pelvic:  Cervical exam deferred        Extremities: Normal range of motion.     ental Status: Normal mood and affect. Normal behavior. Normal judgment and thought content.   US Ob Comp + 14 Wk  Result Date: 01/03/2018 Patient Name: Madison Lee DOB: [birth year] MRN: [id number] ULTRASOUND REPORT Location: [facility] Date of Service: 12/30/2017 Indications:Anatomy Ultrasound Findings: Mason Jim intrauterine pregnancy is visualized with FHR at 135 BPM. Biometrics give an (U/S) Gestational age of  [redacted]w[redacted]d and an (U/S) EDD of 05/14/18; this correlates with the clinically established Estimated Date of Delivery: 05/18/18 Fetal presentation is Cephalic. EFW: 13oz/357. Placenta: anterior. Grade: 0. Plac to cervical IO distance = 3.62cm AFI: subjectively normal. Anatomic survey is incomplete for Nose/lips and profile d/t fetal position and normal; Gender - surprise.  Right Ovary is normal in appearance. Left Ovary is normal appearance. Survey of the adnexa demonstrates no adnexal masses. There is no free peritoneal fluid in the cul de sac. Impression: 1. [redacted]w[redacted]d Viable Singleton Intrauterine pregnancy by U/S. 2. (U/S) EDD is consistent with Clinically established Estimated Date of Delivery: 05/18/18 . 3. Normal Anatomy Scan,  incomplete for Nose/lips and profile d/t fetal position Recommendations: 1.Clinical correlation with the patient's History and Physical Exam. Augustine Radar, RDMS Review of ULTRASOUND. I have personally reviewed images and report of recent ultrasound done at [facility]. There is a singleton gestation with subjectively normal amniotic fluid volume. The fetal biometry correlates with established dating. Detailed evaluation of the fetal anatomy was performed.The fetal anatomical survey appears within normal limits within the resolution of ultrasound as described above.  It must be noted that a normal ultrasound is unable to rule out fetal aneuploidy.  Annamarie Major, MD, [facility], [facility] 01/03/2018  10:21 AM   US Ob Follow Up  Result Date: 01/28/2018 Patient Name: Madison Lee DOB: August 17, 1995 MRN: [id number] ULTRASOUND REPORT Location: [facility] Date of Service: 01/27/2018 Indications: Anatomy follow up ultrasound Findings: Mason Jim intrauterine pregnancy is visualized with FHR at 138 BPM. Fetal presentation is Cephalic. Placenta: anterior. Grade: 0 AFI: subjectively normal. Anatomic survey is complete for nose/lips and profile. There is no free peritoneal fluid  in the cul de sac. Impression: 1. [redacted]w[redacted]d Viable Singleton Intrauterine pregnancy previously  established criteria. 2. Normal Anatomy Scan is now complete Recommendations: 1.Clinical correlation with the patient's History and Physical Exam. Darlina Guys, RDMS RVT  There is a singleton gestation with subjectively normal amniotic fluid volume.  Limited evaluation of the fetal anatomy was performed today, focusing on on anatomic structures not fully visualized at the time of prior study.The visualized fetal anatomical survey appears within normal limits within the resolution of ultrasound as described above, and the anatomic survey is now complete.  It must be noted that a normal ultrasound is unable to rule out fetal aneuploidy.  Vena Austria, MD, FACOG [facility], [facility]     Assessment   22 y.o. G1P0 at [redacted]w[redacted]d by  05/18/2018, by Last Menstrual Period presenting for routine prenatal visit  Plan   pregnancy1 Problems (from 09/10/17 to present)    Problem Noted Resolved   Supervision of normal first pregnancy, antepartum 10/26/2017 by Natale Milch, MD No   Overview Addendum 01/28/2018 10:04 PM by Vena Austria, MD      Clinic [facility] Prenatal Labs  Dating LMP = 11 wk [place] Blood type: A/Positive/-- (06/07 1632)   Genetic Screen 1 Screen:  negative Antibody:Negative (06/07 1632)  Anatomic [place] Complete Rubella: 3.25 (06/07 1632)  Varicella: Immune  GTT 28 wk:      RPR: Non Reactive (06/07 1632)   Rhogam  not indicated HBsAg: Negative (06/07 1632)   TDaP vaccine                       HIV: Non Reactive (06/07 1632)   Flu Shot  12/30/17                              GBS:   Contraception  Pap: NEEDS PAP  CBB     CS/VBAC    Baby Food    Support Person                 Gestational age appropriate obstetric precautions including but not limited to vaginal bleeding, contractions, leaking of fluid and fetal movement were reviewed in detail with the patient.   -  anatomy screen complete  Return in about 4 weeks (around 02/24/2018) for ROB and 28 week labs.  Vena Austria, MD, [facility], [facility]

## 2018-02-25 ENCOUNTER — Ambulatory Visit (INDEPENDENT_AMBULATORY_CARE_PROVIDER_SITE_OTHER): Payer: BLUE CROSS/BLUE SHIELD | Admitting: Maternal Newborn

## 2018-02-25 ENCOUNTER — Other Ambulatory Visit: Payer: BLUE CROSS/BLUE SHIELD

## 2018-02-25 ENCOUNTER — Encounter: Payer: Self-pay | Admitting: Maternal Newborn

## 2018-02-25 VITALS — BP 120/68 | Wt 163.0 lb

## 2018-02-25 DIAGNOSIS — Z3400 Encounter for supervision of normal first pregnancy, unspecified trimester: Secondary | ICD-10-CM

## 2018-02-25 DIAGNOSIS — Z3403 Encounter for supervision of normal first pregnancy, third trimester: Secondary | ICD-10-CM

## 2018-02-25 DIAGNOSIS — Z3A28 28 weeks gestation of pregnancy: Secondary | ICD-10-CM

## 2018-02-25 LAB — POCT URINALYSIS DIPSTICK OB
Glucose, UA: NEGATIVE
PROTEIN: NEGATIVE

## 2018-02-25 NOTE — Progress Notes (Signed)
ROB and 28 wk labs/1 hr GTT- no concerns

## 2018-02-25 NOTE — Progress Notes (Signed)
    Routine Prenatal Care Visit  Subjective  Madison Lee is a 22 y.o. G1P0 at [redacted]w[redacted]d being seen today for ongoing prenatal care.  She is currently monitored for the following issues for this low-risk pregnancy and has Supervision of normal first pregnancy, antepartum on their problem list.  ----------------------------------------------------------------------------------- Patient reports restless legs.   Contractions: Not present. Vag. Bleeding: None.  Movement: Present. No leaking of fluid.  ----------------------------------------------------------------------------------- The following portions of the patient's history were reviewed and updated as appropriate: allergies, current medications, past family history, past medical history, past social history, past surgical history and problem list. Problem list updated.  Objective  Blood pressure 120/68, weight 163 lb (73.9 kg), last menstrual period 08/11/2017. Pregravid weight 150 lb (68 kg) Total Weight Gain 13 lb (5.897 kg) Body mass index is 28.87 kg/m.   Urinalysis: Protein Negative, Glucose Negative  Fetal Status: Fetal Heart Rate (bpm): 139 Fundal Height: 28 cm Movement: Present     General:  Alert, oriented and cooperative. Patient is in no acute distress.  Skin: Skin is warm and dry. No rash noted.   Cardiovascular: Normal heart rate noted  Respiratory: Normal respiratory effort, no problems with respiration noted  Abdomen: Soft, gravid, appropriate for gestational age. Pain/Pressure: Absent     Pelvic:  Cervical exam deferred        Extremities: Normal range of motion.  Edema: None  Mental Status: Normal mood and affect. Normal behavior. Normal judgment and thought content.    Assessment   22 y.o. G1P0 at [redacted]w[redacted]d, EDD 05/18/2018 by Last Menstrual Period presenting for a routine prenatal visit.  Plan   pregnancy1 Problems (from 09/10/17 to present)    Problem Noted Resolved   Supervision of normal first pregnancy,  antepartum 10/26/2017 by Natale Milch, MD No   Overview Addendum 01/28/2018 10:04 PM by Vena Austria, MD      [facility]inic [facility] Prenatal Labs  Dating LMP = 11 wk [place] Blood type: A/Positive/-- (06/07 1632)   Genetic Screen 1 Screen:  negative Antibody:Negative (06/07 1632)  Anatomic [place] Complete Rubella: 3.25 (06/07 1632)  Varicella: Immune  GTT 28 wk:      RPR: Non Reactive (06/07 1632)   Rhogam  not indicated HBsAg: Negative (06/07 1632)   TDaP vaccine                       HIV: Non Reactive (06/07 1632)   Flu Shot  12/30/17                              GBS:   Contraception  Pap: NEEDS PAP  CBB     CS/VBAC    Baby Food    Support Person              Discussed that magnesium supplementation may help with restless leg symptoms.   Gestational age appropriate obstetric precautions  were reviewed.  Return in about 2 weeks (around 03/11/2018) for ROB.  Marcelyn Bruins, CNM 02/25/2018  2:57 PM

## 2018-02-26 LAB — 28 WEEK RH+PANEL
Basophils Absolute: 0 10*3/uL (ref 0.0–0.2)
Basos: 0 %
EOS (ABSOLUTE): 0.1 10*3/uL (ref 0.0–0.4)
EOS: 1 %
GESTATIONAL DIABETES SCREEN: 95 mg/dL (ref 65–139)
HEMATOCRIT: 30.4 % — AB (ref 34.0–46.6)
HIV SCREEN 4TH GENERATION: NONREACTIVE
Hemoglobin: 10.7 g/dL — ABNORMAL LOW (ref 11.1–15.9)
IMMATURE GRANULOCYTES: 1 %
Immature Grans (Abs): 0.1 10*3/uL (ref 0.0–0.1)
Lymphocytes Absolute: 2.5 10*3/uL (ref 0.7–3.1)
Lymphs: 23 %
MCH: 30.5 pg (ref 26.6–33.0)
MCHC: 35.2 g/dL (ref 31.5–35.7)
MCV: 87 fL (ref 79–97)
Monocytes Absolute: 0.7 10*3/uL (ref 0.1–0.9)
Monocytes: 6 %
NEUTROS ABS: 7.6 10*3/uL — AB (ref 1.4–7.0)
NEUTROS PCT: 69 %
Platelets: 221 10*3/uL (ref 150–450)
RBC: 3.51 x10E6/uL — ABNORMAL LOW (ref 3.77–5.28)
RDW: 12.4 % (ref 12.3–15.4)
RPR: NONREACTIVE
WBC: 11.1 10*3/uL — ABNORMAL HIGH (ref 3.4–10.8)

## 2018-03-02 ENCOUNTER — Telehealth: Payer: Self-pay | Admitting: Obstetrics & Gynecology

## 2018-03-02 NOTE — Telephone Encounter (Signed)
Called patient to let her know that her FMLA paperwork has been faxed. Voicemail not set up to leave message.

## 2018-03-11 ENCOUNTER — Ambulatory Visit (INDEPENDENT_AMBULATORY_CARE_PROVIDER_SITE_OTHER): Payer: Medicaid Other | Admitting: Advanced Practice Midwife

## 2018-03-11 ENCOUNTER — Encounter: Payer: Self-pay | Admitting: Advanced Practice Midwife

## 2018-03-11 VITALS — BP 114/60 | Wt 164.0 lb

## 2018-03-11 DIAGNOSIS — Z23 Encounter for immunization: Secondary | ICD-10-CM

## 2018-03-11 DIAGNOSIS — Z3A30 30 weeks gestation of pregnancy: Secondary | ICD-10-CM

## 2018-03-11 LAB — POCT URINALYSIS DIPSTICK OB: Glucose, UA: NEGATIVE

## 2018-03-11 NOTE — Progress Notes (Signed)
  Routine Prenatal Care Visit  Subjective  Madison Lee is a 22 y.o. G1P0 at 2957w2d being seen today for ongoing prenatal care.  She is currently monitored for the following issues for this low-risk pregnancy and has Supervision of normal first pregnancy, antepartum on their problem list.  ----------------------------------------------------------------------------------- Patient reports still having restless leg. Recommended magnesium supplement.   Contractions: Not present. Vag. Bleeding: None.  Movement: Present. Denies leaking of fluid.  ----------------------------------------------------------------------------------- The following portions of the patient's history were reviewed and updated as appropriate: allergies, current medications, past family history, past medical history, past social history, past surgical history and problem list. Problem list updated.   Objective  Blood pressure 114/60, weight 164 lb (74.4 kg), last menstrual period 08/11/2017. Pregravid weight 150 lb (68 kg) Total Weight Gain 14 lb (6.35 kg) Urinalysis: Urine Protein Trace  Urine Glucose Negative  Fetal Status: Fetal Heart Rate (bpm): 145 Fundal Height: 30 cm Movement: Present     General:  Alert, oriented and cooperative. Patient is in no acute distress.  Skin: Skin is warm and dry. No rash noted.   Cardiovascular: Normal heart rate noted  Respiratory: Normal respiratory effort, no problems with respiration noted  Abdomen: Soft, gravid, appropriate for gestational age. Pain/Pressure: Absent     Pelvic:  Cervical exam deferred        Extremities: Normal range of motion.  Edema: None  Mental Status: Normal mood and affect. Normal behavior. Normal judgment and thought content.   Assessment   22 y.o. G1P0 at 5657w2d by  05/18/2018, by Last Menstrual Period presenting for routine prenatal visit  Plan   pregnancy1 Problems (from 09/10/17 to present)    Problem Noted Resolved   Supervision of normal  first pregnancy, antepartum 10/26/2017 by Natale MilchSchuman, Christanna R, MD No   Overview Addendum 03/01/2018 12:52 PM by Vena AustriaStaebler, Andreas, MD      Clinic [facility] Prenatal Labs  Dating LMP = 11 wk US Blood type: A/Positive/-- (06/07 1632)   Genetic Screen 1 Screen:  negative Antibody:Negative (06/07 1632)  Anatomic US Complete Rubella: 3.25 (06/07 1632)  Varicella: Immune  GTT 28 wk: 95 RPR: Non Reactive (06/07 1632)   Rhogam  not indicated HBsAg: Negative (06/07 1632)   TDaP vaccine    03/11/18                   HIV: Non Reactive (06/07 1632)   Flu Shot  12/30/17                              GBS:   Contraception  Pap: NEEDS PAP  CBB     CS/VBAC    Baby Food    Support Person                 Preterm labor symptoms and general obstetric precautions including but not limited to vaginal bleeding, contractions, leaking of fluid and fetal movement were reviewed in detail with the patient. Please refer to After Visit Summary for other counseling recommendations.   Return in about 2 weeks (around 03/25/2018) for rob.  Tresea MallJane Demir Titsworth, CNM 03/11/2018 3:12 PM

## 2018-03-11 NOTE — Progress Notes (Signed)
ROB and TDap & BT consent today- no concerns

## 2018-03-11 NOTE — Patient Instructions (Signed)
Third Trimester of Pregnancy The third trimester is from week 28 through week 40 (months 7 through 9). The third trimester is a time when the unborn baby (fetus) is growing rapidly. At the end of the ninth month, the fetus is about 20 inches in length and weighs 6-10 pounds. Body changes during your third trimester Your body will continue to go through many changes during pregnancy. The changes vary from woman to woman. During the third trimester:  Your weight will continue to increase. You can expect to gain 25-35 pounds (11-16 kg) by the end of the pregnancy.  You may begin to get stretch marks on your hips, abdomen, and breasts.  You may urinate more often because the fetus is moving lower into your pelvis and pressing on your bladder.  You may develop or continue to have heartburn. This is caused by increased hormones that slow down muscles in the digestive tract.  You may develop or continue to have constipation because increased hormones slow digestion and cause the muscles that push waste through your intestines to relax.  You may develop hemorrhoids. These are swollen veins (varicose veins) in the rectum that can itch or be painful.  You may develop swollen, bulging veins (varicose veins) in your legs.  You may have increased body aches in the pelvis, back, or thighs. This is due to weight gain and increased hormones that are relaxing your joints.  You may have changes in your hair. These can include thickening of your hair, rapid growth, and changes in texture. Some women also have hair loss during or after pregnancy, or hair that feels dry or thin. Your hair will most likely return to normal after your baby is born.  Your breasts will continue to grow and they will continue to become tender. A yellow fluid (colostrum) may leak from your breasts. This is the first milk you are producing for your baby.  Your belly button may stick out.  You may notice more swelling in your hands,  face, or ankles.  You may have increased tingling or numbness in your hands, arms, and legs. The skin on your belly may also feel numb.  You may feel short of breath because of your expanding uterus.  You may have more problems sleeping. This can be caused by the size of your belly, increased need to urinate, and an increase in your body's metabolism.  You may notice the fetus "dropping," or moving lower in your abdomen (lightening).  You may have increased vaginal discharge.  You may notice your joints feel loose and you may have pain around your pelvic bone.  What to expect at prenatal visits You will have prenatal exams every 2 weeks until week 36. Then you will have weekly prenatal exams. During a routine prenatal visit:  You will be weighed to make sure you and the baby are growing normally.  Your blood pressure will be taken.  Your abdomen will be measured to track your baby's growth.  The fetal heartbeat will be listened to.  Any test results from the previous visit will be discussed.  You may have a cervical check near your due date to see if your cervix has softened or thinned (effaced).  You will be tested for Group B streptococcus. This happens between 35 and 37 weeks.  Your health care provider may ask you:  What your birth plan is.  How you are feeling.  If you are feeling the baby move.  If you have had   any abnormal symptoms, such as leaking fluid, bleeding, severe headaches, or abdominal cramping.  If you are using any tobacco products, including cigarettes, chewing tobacco, and electronic cigarettes.  If you have any questions.  Other tests or screenings that may be performed during your third trimester include:  Blood tests that check for low iron levels (anemia).  Fetal testing to check the health, activity level, and growth of the fetus. Testing is done if you have certain medical conditions or if there are problems during the  pregnancy.  Nonstress test (NST). This test checks the health of your baby to make sure there are no signs of problems, such as the baby not getting enough oxygen. During this test, a belt is placed around your belly. The baby is made to move, and its heart rate is monitored during movement.  What is false labor? False labor is a condition in which you feel small, irregular tightenings of the muscles in the womb (contractions) that usually go away with rest, changing position, or drinking water. These are called Braxton Hicks contractions. Contractions may last for hours, days, or even weeks before true labor sets in. If contractions come at regular intervals, become more frequent, increase in intensity, or become painful, you should see your health care provider. What are the signs of labor?  Abdominal cramps.  Regular contractions that start at 10 minutes apart and become stronger and more frequent with time.  Contractions that start on the top of the uterus and spread down to the lower abdomen and back.  Increased pelvic pressure and dull back pain.  A watery or bloody mucus discharge that comes from the vagina.  Leaking of amniotic fluid. This is also known as your "water breaking." It could be a slow trickle or a gush. Let your health care provider know if it has a color or strange odor. If you have any of these signs, call your health care provider right away, even if it is before your due date. Follow these instructions at home: Medicines  Follow your health care provider's instructions regarding medicine use. Specific medicines may be either safe or unsafe to take during pregnancy.  Take a prenatal vitamin that contains at least 600 micrograms (mcg) of folic acid.  If you develop constipation, try taking a stool softener if your health care provider approves. Eating and drinking  Eat a balanced diet that includes fresh fruits and vegetables, whole grains, good sources of protein  such as meat, eggs, or tofu, and low-fat dairy. Your health care provider will help you determine the amount of weight gain that is right for you.  Avoid raw meat and uncooked cheese. These carry germs that can cause birth defects in the baby.  If you have low calcium intake from food, talk to your health care provider about whether you should take a daily calcium supplement.  Eat four or five small meals rather than three large meals a day.  Limit foods that are high in fat and processed sugars, such as fried and sweet foods.  To prevent constipation: ? Drink enough fluid to keep your urine clear or pale yellow. ? Eat foods that are high in fiber, such as fresh fruits and vegetables, whole grains, and beans. Activity  Exercise only as directed by your health care provider. Most women can continue their usual exercise routine during pregnancy. Try to exercise for 30 minutes at least 5 days a week. Stop exercising if you experience uterine contractions.  Avoid heavy   lifting.  Do not exercise in extreme heat or humidity, or at high altitudes.  Wear low-heel, comfortable shoes.  Practice good posture.  You may continue to have sex unless your health care provider tells you otherwise. Relieving pain and discomfort  Take frequent breaks and rest with your legs elevated if you have leg cramps or low back pain.  Take warm sitz baths to soothe any pain or discomfort caused by hemorrhoids. Use hemorrhoid cream if your health care provider approves.  Wear a good support bra to prevent discomfort from breast tenderness.  If you develop varicose veins: ? Wear support pantyhose or compression stockings as told by your healthcare provider. ? Elevate your feet for 15 minutes, 3-4 times a day. Prenatal care  Write down your questions. Take them to your prenatal visits.  Keep all your prenatal visits as told by your health care provider. This is important. Safety  Wear your seat belt at  all times when driving.  Make a list of emergency phone numbers, including numbers for family, friends, the hospital, and police and fire departments. General instructions  Avoid cat litter boxes and soil used by cats. These carry germs that can cause birth defects in the baby. If you have a cat, ask someone to clean the litter box for you.  Do not travel far distances unless it is absolutely necessary and only with the approval of your health care provider.  Do not use hot tubs, steam rooms, or saunas.  Do not drink alcohol.  Do not use any products that contain nicotine or tobacco, such as cigarettes and e-cigarettes. If you need help quitting, ask your health care provider.  Do not use any medicinal herbs or unprescribed drugs. These chemicals affect the formation and growth of the baby.  Do not douche or use tampons or scented sanitary pads.  Do not cross your legs for long periods of time.  To prepare for the arrival of your baby: ? Take prenatal classes to understand, practice, and ask questions about labor and delivery. ? Make a trial run to the hospital. ? Visit the hospital and tour the maternity area. ? Arrange for maternity or paternity leave through employers. ? Arrange for family and friends to take care of pets while you are in the hospital. ? Purchase a rear-facing car seat and make sure you know how to install it in your car. ? Pack your hospital bag. ? Prepare the baby's nursery. Make sure to remove all pillows and stuffed animals from the baby's crib to prevent suffocation.  Visit your dentist if you have not gone during your pregnancy. Use a soft toothbrush to brush your teeth and be gentle when you floss. Contact a health care provider if:  You are unsure if you are in labor or if your water has broken.  You become dizzy.  You have mild pelvic cramps, pelvic pressure, or nagging pain in your abdominal area.  You have lower back pain.  You have persistent  nausea, vomiting, or diarrhea.  You have an unusual or bad smelling vaginal discharge.  You have pain when you urinate. Get help right away if:  Your water breaks before 37 weeks.  You have regular contractions less than 5 minutes apart before 37 weeks.  You have a fever.  You are leaking fluid from your vagina.  You have spotting or bleeding from your vagina.  You have severe abdominal pain or cramping.  You have rapid weight loss or weight gain.    You have shortness of breath with chest pain.  You notice sudden or extreme swelling of your face, hands, ankles, feet, or legs.  Your baby makes fewer than 10 movements in 2 hours.  You have severe headaches that do not go away when you take medicine.  You have vision changes. Summary  The third trimester is from week 28 through week 40, months 7 through 9. The third trimester is a time when the unborn baby (fetus) is growing rapidly.  During the third trimester, your discomfort may increase as you and your baby continue to gain weight. You may have abdominal, leg, and back pain, sleeping problems, and an increased need to urinate.  During the third trimester your breasts will keep growing and they will continue to become tender. A yellow fluid (colostrum) may leak from your breasts. This is the first milk you are producing for your baby.  False labor is a condition in which you feel small, irregular tightenings of the muscles in the womb (contractions) that eventually go away. These are called Braxton Hicks contractions. Contractions may last for hours, days, or even weeks before true labor sets in.  Signs of labor can include: abdominal cramps; regular contractions that start at 10 minutes apart and become stronger and more frequent with time; watery or bloody mucus discharge that comes from the vagina; increased pelvic pressure and dull back pain; and leaking of amniotic fluid. This information is not intended to replace advice  given to you by your health care provider. Make sure you discuss any questions you have with your health care provider. Document Released: 03/31/2001 Document Revised: 09/12/2015 Document Reviewed: 06/07/2012 Elsevier Interactive Patient Education  2017 [employer]  

## 2018-03-14 ENCOUNTER — Encounter: Payer: Self-pay | Admitting: Medical

## 2018-03-14 ENCOUNTER — Ambulatory Visit: Payer: Medicaid Other | Admitting: Medical

## 2018-03-14 VITALS — BP 128/74 | HR 113 | Temp 98.6°F | Resp 18 | Wt 161.4 lb

## 2018-03-14 DIAGNOSIS — R0981 Nasal congestion: Secondary | ICD-10-CM

## 2018-03-14 DIAGNOSIS — B349 Viral infection, unspecified: Secondary | ICD-10-CM

## 2018-03-14 NOTE — Progress Notes (Signed)
   Subjective:    Patient ID: Madison Lee, female    DOB: 06/09/1995, 22 y.o.   MRN: [id number]  HPI 22 yo female in non acute distress.  [redacted] weeks pregnant.started Friday with mild symptoms pressure in nose and lymph nodes. Progressed to a stuffy nose, headache and runny nose. Nasal congestion. Taking Tylenol 650 mg  At  7:30am.  Still complains of headache, no cough clear nasal discharge with in the morning itis a bit yellow. Denies fever or chills, denies shortness of breath or chest pain.    5/10 headache. Allergies  Allergen Reactions  . Amoxicillin Hives    Blood pressure 128/74, pulse (!) 113, temperature 98.6 F (37 C), temperature source Tympanic, resp. rate 18, weight 161 lb 6.4 oz (73.2 kg), last menstrual period 08/11/2017, SpO2 97 %.  Review of Systems  Constitutional: Negative for chills and fever.  HENT: Positive for congestion, ear pain, postnasal drip, rhinorrhea, sinus pressure, sinus pain, sneezing and voice change (" due to nasal congestion"). Negative for ear discharge and sore throat.   Eyes: Negative for discharge and itching.  Respiratory: Negative for cough, chest tightness, shortness of breath and wheezing.   Cardiovascular: Positive for leg swelling ("due to pregnancy" "my normal"). Negative for chest pain and palpitations.  Gastrointestinal: Negative for abdominal pain.  Endocrine: Negative for polydipsia, polyphagia and polyuria.  Genitourinary: Negative for dysuria.  Musculoskeletal: Negative for myalgias.  Skin: Negative for rash.  Allergic/Immunologic: Positive for environmental allergies. Negative for food allergies.  Neurological: Positive for headaches (temples). Negative for dizziness, syncope and light-headedness.  Hematological: Positive for adenopathy.  Psychiatric/Behavioral: Negative for behavioral problems, self-injury and suicidal ideas.      Tdap vaccine on Friday Objective:   Physical Exam  Constitutional: She is oriented to  person, place, and time. She appears well-developed and well-nourished.  HENT:  Head: Normocephalic and atraumatic.  Right Ear: Hearing, external ear and ear canal normal. A middle ear effusion is present.  Left Ear: Hearing, external ear and ear canal normal. A middle ear effusion is present.  Nose: Mucosal edema (right side) and rhinorrhea (clear) present.  Mouth/Throat: Uvula is midline, oropharynx is clear and moist and mucous membranes are normal. Tonsils are 1+ on the right. Tonsils are 1+ on the left.  Eyes: Pupils are equal, round, and reactive to light. Conjunctivae and EOM are normal.  Neck: Normal range of motion. Neck supple.  Cardiovascular: Regular rhythm and normal heart sounds.  Pulmonary/Chest: Effort normal. Tachypnea noted.  Lymphadenopathy:    She has no cervical adenopathy.  Neurological: She is alert and oriented to person, place, and time.  Skin: Skin is warm and dry.  Psychiatric: She has a normal mood and affect. Her behavior is normal. Judgment and thought content normal.  Nursing note and vitals reviewed.         Assessment & Plan:  Viral illness Rest , Increase fluids, Continue Tylenol as directed. May do Saline rinces  and OTC Claritin take as directed for nasal congestion, no sudafed.. If fever 100.5 or higher, yellow or green discharge , change in breathing , shortness of breath or wheezing to follow up with OB/GYN for further examination. Patient verbalizes understanding and has no questions at discharge. Given work note for today and tomorrow.

## 2018-03-14 NOTE — Patient Instructions (Signed)
Viral Illness, Adult Viruses are tiny germs that can get into a person's body and cause illness. There are many different types of viruses, and they cause many types of illness. Viral illnesses can range from mild to severe. They can affect various parts of the body. Common illnesses that are caused by a virus include colds and the flu. Viral illnesses also include serious conditions such as HIV/AIDS (human immunodeficiency virus/acquired immunodeficiency syndrome). A few viruses have been linked to certain cancers. What are the causes? Many types of viruses can cause illness. Viruses invade cells in your body, multiply, and cause the infected cells to malfunction or die. When the cell dies, it releases more of the virus. When this happens, you develop symptoms of the illness, and the virus continues to spread to other cells. If the virus takes over the function of the cell, it can cause the cell to divide and grow out of control, as is the case when a virus causes cancer. Different viruses get into the body in different ways. You can get a virus by:  Swallowing food or water that is contaminated with the virus.  Breathing in droplets that have been coughed or sneezed into the air by an infected person.  Touching a surface that has been contaminated with the virus and then touching your eyes, nose, or mouth.  Being bitten by an insect or animal that carries the virus.  Having sexual contact with a person who is infected with the virus.  Being exposed to blood or fluids that contain the virus, either through an open cut or during a transfusion.  If a virus enters your body, your body's defense system (immune system) will try to fight the virus. You may be at higher risk for a viral illness if your immune system is weak. What are the signs or symptoms? Symptoms vary depending on the type of virus and the location of the cells that it invades. Common symptoms of the main types of viral illnesses  include: Cold and flu viruses  Fever.  Headache.  Sore throat.  Muscle aches.  Nasal congestion.  Cough. Digestive system (gastrointestinal) viruses  Fever.  Abdominal pain.  Nausea.  Diarrhea. Liver viruses (hepatitis)  Loss of appetite.  Tiredness.  Yellowing of the skin (jaundice). Brain and spinal cord viruses  Fever.  Headache.  Stiff neck.  Nausea and vomiting.  Confusion or sleepiness. Skin viruses  Warts.  Itching.  Rash. Sexually transmitted viruses  Discharge.  Swelling.  Redness.  Rash. How is this treated? Viruses can be difficult to treat because they live within cells. Antibiotic medicines do not treat viruses because these drugs do not get inside cells. Treatment for a viral illness may include:  Resting and drinking plenty of fluids.  Medicines to relieve symptoms. These can include over-the-counter medicine for pain and fever, medicines for cough or congestion, and medicines to relieve diarrhea.  Antiviral medicines. These drugs are available only for certain types of viruses. They may help reduce flu symptoms if taken early. There are also many antiviral medicines for hepatitis and HIV/AIDS.  Some viral illnesses can be prevented with vaccinations. A common example is the flu shot. Follow these instructions at home: Medicines   Take over-the-counter and prescription medicines only as told by your health care provider.  If you were prescribed an antiviral medicine, take it as told by your health care provider. Do not stop taking the medicine even if you start to feel better.  Be aware   of when antibiotics are needed and when they are not needed. Antibiotics do not treat viruses. If your health care provider thinks that you may have a bacterial infection as well as a viral infection, you may get an antibiotic. ? Do not ask for an antibiotic prescription if you have been diagnosed with a viral illness. That will not make your  illness go away faster. ? Frequently taking antibiotics when they are not needed can lead to antibiotic resistance. When this develops, the medicine no longer works against the bacteria that it normally fights. General instructions  Drink enough fluids to keep your urine clear or pale yellow.  Rest as much as possible.  Return to your normal activities as told by your health care provider. Ask your health care provider what activities are safe for you.  Keep all follow-up visits as told by your health care provider. This is important. How is this prevented? Take these actions to reduce your risk of viral infection:  Eat a healthy diet and get enough rest.  Wash your hands often with soap and water. This is especially important when you are in public places. If soap and water are not available, use hand sanitizer.  Avoid close contact with friends and family who have a viral illness.  If you travel to areas where viral gastrointestinal infection is common, avoid drinking water or eating raw food.  Keep your immunizations up to date. Get a flu shot every year as told by your health care provider.  Do not share toothbrushes, nail clippers, razors, or needles with other people.  Always practice safe sex.  Contact a health care provider if:  You have symptoms of a viral illness that do not go away.  Your symptoms come back after going away.  Your symptoms get worse. Get help right away if:  You have trouble breathing.  You have a severe headache or a stiff neck.  You have severe vomiting or abdominal pain. This information is not intended to replace advice given to you by your health care provider. Make sure you discuss any questions you have with your health care provider. Document Released: 08/16/2015 Document Revised: 09/18/2015 Document Reviewed: 08/16/2015 [employer] Interactive Patient Education  2018 [employer]  

## 2018-03-25 ENCOUNTER — Encounter: Payer: Medicaid Other | Admitting: Obstetrics and Gynecology

## 2018-03-31 ENCOUNTER — Ambulatory Visit (INDEPENDENT_AMBULATORY_CARE_PROVIDER_SITE_OTHER): Payer: Medicaid Other | Admitting: Obstetrics and Gynecology

## 2018-03-31 ENCOUNTER — Encounter: Payer: Self-pay | Admitting: Obstetrics and Gynecology

## 2018-03-31 VITALS — BP 122/67 | Wt 167.0 lb

## 2018-03-31 DIAGNOSIS — Z3403 Encounter for supervision of normal first pregnancy, third trimester: Secondary | ICD-10-CM

## 2018-03-31 DIAGNOSIS — Z3400 Encounter for supervision of normal first pregnancy, unspecified trimester: Secondary | ICD-10-CM

## 2018-03-31 DIAGNOSIS — Z3A33 33 weeks gestation of pregnancy: Secondary | ICD-10-CM

## 2018-03-31 LAB — POCT URINALYSIS DIPSTICK OB
Glucose, UA: NEGATIVE
POC,PROTEIN,UA: NEGATIVE

## 2018-03-31 NOTE — Progress Notes (Signed)
  Routine Prenatal Care Visit  Subjective  Madison Lee is a 22 y.o. G1P0 at 2539w1d being seen today for ongoing prenatal care.  She is currently monitored for the following issues for this low-risk pregnancy and has Supervision of normal first pregnancy, antepartum on their problem list.  ----------------------------------------------------------------------------------- Patient reports no complaints.   Contractions: Not present. Vag. Bleeding: None.  Movement: Present. Denies leaking of fluid.  ----------------------------------------------------------------------------------- The following portions of the patient's history were reviewed and updated as appropriate: allergies, current medications, past family history, past medical history, past social history, past surgical history and problem list. Problem list updated.   Objective  Blood pressure 122/67, weight 167 lb (75.8 kg), last menstrual period 08/11/2017. Pregravid weight 150 lb (68 kg) Total Weight Gain 17 lb (7.711 kg) Urinalysis: Urine Protein Negative  Urine Glucose Negative  Fetal Status: Fetal Heart Rate (bpm): 145 Fundal Height: 33 cm Movement: Present     General:  Alert, oriented and cooperative. Patient is in no acute distress.  Skin: Skin is warm and dry. No rash noted.   Cardiovascular: Normal heart rate noted  Respiratory: Normal respiratory effort, no problems with respiration noted  Abdomen: Soft, gravid, appropriate for gestational age. Pain/Pressure: Absent     Pelvic:  Cervical exam deferred        Extremities: Normal range of motion.  Edema: None  Mental Status: Normal mood and affect. Normal behavior. Normal judgment and thought content.   Assessment   22 y.o. G1P0 at 6039w1d by  05/18/2018, by Last Menstrual Period presenting for routine prenatal visit  Plan   pregnancy1 Problems (from 09/10/17 to present)    Problem Noted Resolved   Supervision of normal first pregnancy, antepartum 10/26/2017 by  Natale MilchSchuman, Christanna R, MD No   Overview Addendum 03/01/2018 12:52 PM by Vena AustriaStaebler, Andreas, MD      Clinic [facility] Prenatal Labs  Dating LMP = 11 wk US Blood type: A/Positive/-- (06/07 1632)   Genetic Screen 1 Screen:  negative Antibody:Negative (06/07 1632)  Anatomic US Complete Rubella: 3.25 (06/07 1632)  Varicella: Immune  GTT 28 wk: 95 RPR: Non Reactive (06/07 1632)   Rhogam  not indicated HBsAg: Negative (06/07 1632)   TDaP vaccine                       HIV: Non Reactive (06/07 1632)   Flu Shot  12/30/17                              GBS:   Contraception  Pap: NEEDS PAP  CBB     CS/VBAC    Baby Food    Support Person                 Preterm labor symptoms and general obstetric precautions including but not limited to vaginal bleeding, contractions, leaking of fluid and fetal movement were reviewed in detail with the patient. Please refer to After Visit Summary for other counseling recommendations.   Return in about 2 weeks (around 04/14/2018) for Routine Prenatal Appointment.  Thomasene MohairStephen Michah Minton, MD, Merlinda FrederickFACOG [facility], [facility] 03/31/2018 5:23 PM

## 2018-04-11 ENCOUNTER — Ambulatory Visit (INDEPENDENT_AMBULATORY_CARE_PROVIDER_SITE_OTHER): Payer: Medicaid Other | Admitting: Obstetrics and Gynecology

## 2018-04-11 ENCOUNTER — Other Ambulatory Visit: Payer: Self-pay

## 2018-04-11 ENCOUNTER — Encounter: Payer: Self-pay | Admitting: Obstetrics and Gynecology

## 2018-04-11 VITALS — BP 118/70 | Wt 169.0 lb

## 2018-04-11 DIAGNOSIS — Z3400 Encounter for supervision of normal first pregnancy, unspecified trimester: Secondary | ICD-10-CM

## 2018-04-11 DIAGNOSIS — Z3A34 34 weeks gestation of pregnancy: Secondary | ICD-10-CM

## 2018-04-11 DIAGNOSIS — Z3403 Encounter for supervision of normal first pregnancy, third trimester: Secondary | ICD-10-CM

## 2018-04-11 LAB — POCT URINALYSIS DIPSTICK OB
Glucose, UA: NEGATIVE
POC,PROTEIN,UA: NEGATIVE

## 2018-04-11 NOTE — Progress Notes (Signed)
  Routine Prenatal Care Visit  Subjective  Madison Lee is a 22 y.o. G1P0 at 2279w5d being seen today for ongoing prenatal care.  She is currently monitored for the following issues for this low-risk pregnancy and has Supervision of normal first pregnancy, antepartum on their problem list.  ----------------------------------------------------------------------------------- Patient reports no complaints.   Contractions: Not present. Vag. Bleeding: None.  Movement: Present. Denies leaking of fluid.  ----------------------------------------------------------------------------------- The following portions of the patient's history were reviewed and updated as appropriate: allergies, current medications, past family history, past medical history, past social history, past surgical history and problem list. Problem list updated.   Objective  Blood pressure 118/70, weight 169 lb (76.7 kg), last menstrual period 08/11/2017. Pregravid weight 150 lb (68 kg) Total Weight Gain 19 lb (8.618 kg) Urinalysis: Urine Protein    Urine Glucose    Fetal Status: Fetal Heart Rate (bpm): 155 Fundal Height: 34 cm Movement: Present     General:  Alert, oriented and cooperative. Patient is in no acute distress.  Skin: Skin is warm and dry. No rash noted.   Cardiovascular: Normal heart rate noted  Respiratory: Normal respiratory effort, no problems with respiration noted  Abdomen: Soft, gravid, appropriate for gestational age. Pain/Pressure: Absent     Pelvic:  Cervical exam deferred        Extremities: Normal range of motion.  Edema: None  Mental Status: Normal mood and affect. Normal behavior. Normal judgment and thought content.   Assessment   22 y.o. G1P0 at 879w5d by  05/18/2018, by Last Menstrual Period presenting for routine prenatal visit  Plan   pregnancy1 Problems (from 09/10/17 to present)    Problem Noted Resolved   Supervision of normal first pregnancy, antepartum 10/26/2017 by Natale MilchSchuman,  Christanna R, MD No   Overview Addendum 03/01/2018 12:52 PM by Vena AustriaStaebler, Andreas, MD      [facility] Prenatal Labs  Dating LMP = 11 wk US Blood type: A/Positive/-- (06/07 1632)   Genetic Screen 1 Screen:  negative Antibody:Negative (06/07 1632)  Anatomic US Complete Rubella: 3.25 (06/07 1632)  Varicella: Immune  GTT 28 wk: 95 RPR: Non Reactive (06/07 1632)   Rhogam  not indicated HBsAg: Negative (06/07 1632)   TDaP vaccine                       HIV: Non Reactive (06/07 1632)   Flu Shot  12/30/17                              GBS:   Contraception  Pap: NEEDS PAP  CBB     CS/VBAC    Baby Food    Support Person                 Preterm labor symptoms and general obstetric precautions including but not limited to vaginal bleeding, contractions, leaking of fluid and fetal movement were reviewed in detail with the patient. Please refer to After Visit Summary for other counseling recommendations.   Return in about 2 weeks (around 04/25/2018) for Routine Prenatal Appointment.  Thomasene MohairStephen Mahad Newstrom, MD, Merlinda FrederickFACOG [facility], [facility] 04/11/2018 4:32 PM

## 2018-04-20 NOTE — L&D Delivery Note (Signed)
Delivery Note At 5:52 PM a viable female was delivered via Vaginal, Spontaneous (Presentation: ROA).  APGAR: 8, 9; weight pending.   Placenta status: delivered spontaneously, intact.  Cord: 3VC with the following complications: none.  Cord pH: n/a  Anesthesia:  epidural Episiotomy: None Lacerations: 2nd degree;Vaginal Suture Repair: 3.0 vicryl Est. Blood Loss (mL): 475  Mom to postpartum.  Baby to Couplet care / Skin to Skin.  Called to see patient.  Mom pushed to deliver a viable female infant.  The head followed by shoulders, which delivered without difficulty, and the rest of the body.  A single, tight nuchal cord noted and delivered through.  Baby to mom's chest.  Cord clamped and cut after > 1 min delay.  No cord blood obtained.  Placenta delivered spontaneously, intact, with a 3-vessel cord.  Second degree vaginal laceration repaired with 3-0 Vicryl in standard fashion.  All counts correct.  Hemostasis obtained with IV pitocin and fundal massage. EBL 475 mL.     Thomasene Mohair, MD 05/19/2018, 6:26 PM

## 2018-04-25 ENCOUNTER — Encounter: Payer: Self-pay | Admitting: Obstetrics and Gynecology

## 2018-04-25 ENCOUNTER — Ambulatory Visit (INDEPENDENT_AMBULATORY_CARE_PROVIDER_SITE_OTHER): Payer: Medicaid Other | Admitting: Obstetrics and Gynecology

## 2018-04-25 ENCOUNTER — Other Ambulatory Visit (HOSPITAL_COMMUNITY)
Admission: RE | Admit: 2018-04-25 | Discharge: 2018-04-25 | Disposition: A | Discharge status: Home / Self Care | Payer: Medicaid Other | Source: Ambulatory Visit | Attending: Obstetrics and Gynecology | Admitting: Obstetrics and Gynecology

## 2018-04-25 VITALS — BP 122/70 | Wt 172.0 lb

## 2018-04-25 DIAGNOSIS — Z3400 Encounter for supervision of normal first pregnancy, unspecified trimester: Secondary | ICD-10-CM | POA: Insufficient documentation

## 2018-04-25 DIAGNOSIS — Z3A36 36 weeks gestation of pregnancy: Secondary | ICD-10-CM

## 2018-04-25 NOTE — Progress Notes (Signed)
  Routine Prenatal Care Visit  Subjective  Madison Lee is a 23 y.o. G1P0 at [redacted]w[redacted]d being seen today for ongoing prenatal care.  She is currently monitored for the following issues for this low-risk pregnancy and has Supervision of normal first pregnancy, antepartum on their problem list.  ----------------------------------------------------------------------------------- Patient reports no complaints.   Contractions: Not present. Vag. Bleeding: None.  Movement: Present. Denies leaking of fluid.  ----------------------------------------------------------------------------------- The following portions of the patient's history were reviewed and updated as appropriate: allergies, current medications, past family history, past medical history, past social history, past surgical history and problem list. Problem list updated.   Objective  Blood pressure 122/70, weight 172 lb (78 kg), last menstrual period 08/11/2017. Pregravid weight 150 lb (68 kg) Total Weight Gain 22 lb (9.979 kg) Urinalysis: Urine Protein    Urine Glucose    Fetal Status: Fetal Heart Rate (bpm): 130 Fundal Height: 36 cm Movement: Present  Presentation: Vertex  General:  Alert, oriented and cooperative. Patient is in no acute distress.  Skin: Skin is warm and dry. No rash noted.   Cardiovascular: Normal heart rate noted  Respiratory: Normal respiratory effort, no problems with respiration noted  Abdomen: Soft, gravid, appropriate for gestational age. Pain/Pressure: Absent     Pelvic:  Cervical exam performed Dilation: 1 Effacement (%): 40 Station: -2  Extremities: Normal range of motion.  Edema: None  Mental Status: Normal mood and affect. Normal behavior. Normal judgment and thought content.   Assessment   23 y.o. G1P0 at [redacted]w[redacted]d by  05/18/2018, by Last Menstrual Period presenting for routine prenatal visit  Plan   pregnancy1 Problems (from 09/10/17 to present)    Problem Noted Resolved   Supervision of normal  first pregnancy, antepartum 10/26/2017 by Natale Milch, MD No   Overview Addendum 03/01/2018 12:52 PM by Vena Austria, MD      [facility]inic [facility] Prenatal Labs  Dating LMP = 11 wk [place] Blood type: A/Positive/-- (06/07 1632)   Genetic Screen 1 Screen:  negative Antibody:Negative (06/07 1632)  Anatomic [place] Complete Rubella: 3.25 (06/07 1632)  Varicella: Immune  GTT 28 wk: 95 RPR: Non Reactive (06/07 1632)   Rhogam  not indicated HBsAg: Negative (06/07 1632)   TDaP vaccine                       HIV: Non Reactive (06/07 1632)   Flu Shot  12/30/17                              GBS:   Contraception  Pap: NEEDS PAP  CBB     CS/VBAC    Baby Food    Support Person             Preterm labor symptoms and general obstetric precautions including but not limited to vaginal bleeding, contractions, leaking of fluid and fetal movement were reviewed in detail with the patient. Please refer to After Visit Summary for other counseling recommendations.   - gbs/aptima today.  BSUS for presentation  Return in about 1 week (around 05/02/2018) for Routine Prenatal Appointment (if requests Dr Jean Rosenthal, may double book).  Thomasene Mohair, MD, [facility], [facility] 04/25/2018 5:40 PM

## 2018-04-26 DIAGNOSIS — Z3400 Encounter for supervision of normal first pregnancy, unspecified trimester: Secondary | ICD-10-CM | POA: Diagnosis not present

## 2018-04-28 LAB — CERVICOVAGINAL ANCILLARY ONLY
Chlamydia: NEGATIVE
Neisseria Gonorrhea: NEGATIVE

## 2018-04-30 LAB — STREP GP B CULTURE+RFLX: STREP GP B CULTURE+RFLX: NEGATIVE

## 2018-05-02 ENCOUNTER — Ambulatory Visit (INDEPENDENT_AMBULATORY_CARE_PROVIDER_SITE_OTHER): Payer: Medicaid Other | Admitting: Obstetrics & Gynecology

## 2018-05-02 VITALS — BP 100/60 | Wt 172.0 lb

## 2018-05-02 DIAGNOSIS — Z3403 Encounter for supervision of normal first pregnancy, third trimester: Secondary | ICD-10-CM

## 2018-05-02 DIAGNOSIS — Z3A37 37 weeks gestation of pregnancy: Secondary | ICD-10-CM

## 2018-05-02 DIAGNOSIS — Z3400 Encounter for supervision of normal first pregnancy, unspecified trimester: Secondary | ICD-10-CM

## 2018-05-02 LAB — POCT URINALYSIS DIPSTICK OB
Glucose, UA: NEGATIVE
POC,PROTEIN,UA: NEGATIVE

## 2018-05-02 NOTE — Addendum Note (Signed)
Addended by: Cornelius Moras D on: 05/02/2018 04:24 PM   Modules accepted: Orders

## 2018-05-02 NOTE — Patient Instructions (Signed)
Braxton Hicks Contractions Contractions of the uterus can occur throughout pregnancy, but they are not always a sign that you are in labor. You may have practice contractions called Braxton Hicks contractions. These false labor contractions are sometimes confused with true labor. What are Braxton Hicks contractions? Braxton Hicks contractions are tightening movements that occur in the muscles of the uterus before labor. Unlike true labor contractions, these contractions do not result in opening (dilation) and thinning of the cervix. Toward the end of pregnancy (32-34 weeks), Braxton Hicks contractions can happen more often and may become stronger. These contractions are sometimes difficult to tell apart from true labor because they can be very uncomfortable. You should not feel embarrassed if you go to the hospital with false labor. Sometimes, the only way to tell if you are in true labor is for your health care provider to look for changes in the cervix. The health care provider will do a physical exam and may monitor your contractions. If you are not in true labor, the exam should show that your cervix is not dilating and your water has not broken. If there are no other health problems associated with your pregnancy, it is completely safe for you to be sent home with false labor. You may continue to have Braxton Hicks contractions until you go into true labor. How to tell the difference between true labor and false labor True labor  Contractions last 30-70 seconds.  Contractions become very regular.  Discomfort is usually felt in the top of the uterus, and it spreads to the lower abdomen and low back.  Contractions do not go away with walking.  Contractions usually become more intense and increase in frequency.  The cervix dilates and gets thinner. False labor  Contractions are usually shorter and not as strong as true labor contractions.  Contractions are usually irregular.  Contractions  are often felt in the front of the lower abdomen and in the groin.  Contractions may go away when you walk around or change positions while lying down.  Contractions get weaker and are shorter-lasting as time goes on.  The cervix usually does not dilate or become thin. Follow these instructions at home:   Take over-the-counter and prescription medicines only as told by your health care provider.  Keep up with your usual exercises and follow other instructions from your health care provider.  Eat and drink lightly if you think you are going into labor.  If Braxton Hicks contractions are making you uncomfortable: ? Change your position from lying down or resting to walking, or change from walking to resting. ? Sit and rest in a tub of warm water. ? Drink enough fluid to keep your urine pale yellow. Dehydration may cause these contractions. ? Do slow and deep breathing several times an hour.  Keep all follow-up prenatal visits as told by your health care provider. This is important. Contact a health care provider if:  You have a fever.  You have continuous pain in your abdomen. Get help right away if:  Your contractions become stronger, more regular, and closer together.  You have fluid leaking or gushing from your vagina.  You pass blood-tinged mucus (bloody show).  You have bleeding from your vagina.  You have low back pain that you never had before.  You feel your baby's head pushing down and causing pelvic pressure.  Your baby is not moving inside you as much as it used to. Summary  Contractions that occur before labor are   called Braxton Hicks contractions, false labor, or practice contractions.  Braxton Hicks contractions are usually shorter, weaker, farther apart, and less regular than true labor contractions. True labor contractions usually become progressively stronger and regular, and they become more frequent.  Manage discomfort from Braxton Hicks contractions  by changing position, resting in a warm bath, drinking plenty of water, or practicing deep breathing. This information is not intended to replace advice given to you by your health care provider. Make sure you discuss any questions you have with your health care provider. Document Released: 08/20/2016 Document Revised: 01/19/2017 Document Reviewed: 08/20/2016 Elsevier Interactive Patient Education  2019 [employer]vier Inc[employer]  

## 2018-05-02 NOTE — Progress Notes (Signed)
  Subjective  Fetal Movement? yes Contractions? no Leaking Fluid? no Vaginal Bleeding? no  Objective  BP 100/60   Wt 172 lb (78 kg)   LMP 08/11/2017   BMI 30.47 kg/m  General: NAD Pumonary: no increased work of breathing Abdomen: gravid, non-tender Extremities: no edema Psychiatric: mood appropriate, affect full  Assessment  23 y.o. G1P0 at [redacted]w[redacted]d by  05/18/2018, by Last Menstrual Period presenting for routine prenatal visit  Plan   Problem List Items Addressed This Visit      Other   Supervision of normal first pregnancy, antepartum    Other Visit Diagnoses    [redacted] weeks gestation of pregnancy    -  Primary    Work note given PNV, [facility], Labor precautions IOL discussed, 41 weeks  Annamarie Major, MD, [facility], [facility] 05/02/2018  4:21 PM

## 2018-05-09 ENCOUNTER — Encounter: Payer: Self-pay | Admitting: Advanced Practice Midwife

## 2018-05-09 ENCOUNTER — Ambulatory Visit (INDEPENDENT_AMBULATORY_CARE_PROVIDER_SITE_OTHER): Payer: Medicaid Other | Admitting: Advanced Practice Midwife

## 2018-05-09 VITALS — BP 118/58 | Wt 172.0 lb

## 2018-05-09 DIAGNOSIS — Z3A38 38 weeks gestation of pregnancy: Secondary | ICD-10-CM

## 2018-05-09 DIAGNOSIS — Z3403 Encounter for supervision of normal first pregnancy, third trimester: Secondary | ICD-10-CM

## 2018-05-09 LAB — POCT URINALYSIS DIPSTICK OB
Glucose, UA: NEGATIVE
POC,PROTEIN,UA: NEGATIVE

## 2018-05-09 NOTE — Progress Notes (Signed)
ROB

## 2018-05-09 NOTE — Progress Notes (Signed)
  Routine Prenatal Care Visit  Subjective  Madison Lee is a 23 y.o. G1P0 at [redacted]w[redacted]d being seen today for ongoing prenatal care.  She is currently monitored for the following issues for this low-risk pregnancy and has Supervision of normal first pregnancy, antepartum on their problem list.  ----------------------------------------------------------------------------------- Patient reports some periods of cramping.   Contractions: Not present. Vag. Bleeding: None.  Movement: Present. Denies leaking of fluid.  ----------------------------------------------------------------------------------- The following portions of the patient's history were reviewed and updated as appropriate: allergies, current medications, past family history, past medical history, past social history, past surgical history and problem list. Problem list updated.   Objective  Blood pressure (!) 118/58, weight 172 lb (78 kg), last menstrual period 08/11/2017. Pregravid weight 150 lb (68 kg) Total Weight Gain 22 lb (9.979 kg) Urinalysis: Urine Protein    Urine Glucose    Fetal Status: Fetal Heart Rate (bpm): 145 Fundal Height: 38 cm Movement: Present  Presentation: Vertex  General:  Alert, oriented and cooperative. Patient is in no acute distress.  Skin: Skin is warm and dry. No rash noted.   Cardiovascular: Normal heart rate noted  Respiratory: Normal respiratory effort, no problems with respiration noted  Abdomen: Soft, gravid, appropriate for gestational age. Pain/Pressure: Present     Pelvic:  Cervical exam performed Dilation: 2 Effacement (%): 70 Station: -1  Extremities: Normal range of motion.     Mental Status: Normal mood and affect. Normal behavior. Normal judgment and thought content.   Assessment   23 y.o. G1P0 at [redacted]w[redacted]d by  05/18/2018, by Last Menstrual Period presenting for routine prenatal visit  Plan   pregnancy1 Problems (from 09/10/17 to present)    Problem Noted Resolved   Supervision of  normal first pregnancy, antepartum 10/26/2017 by Natale Milch, MD No   Overview Addendum 03/01/2018 12:52 PM by Vena Austria, MD      [facility] Prenatal Labs  Dating LMP = 11 wk [place] Blood type: A/Positive/-- (06/07 1632)   Genetic Screen 1 Screen:  negative Antibody:Negative (06/07 1632)  Anatomic [place] Complete Rubella: 3.25 (06/07 1632)  Varicella: Immune  GTT 28 wk: 95 RPR: Non Reactive (06/07 1632)   Rhogam  not indicated HBsAg: Negative (06/07 1632)   TDaP vaccine                       HIV: Non Reactive (06/07 1632)   Flu Shot  12/30/17                              GBS:   Contraception  Pap: NEEDS PAP  CBB     CS/VBAC    Baby Food    Support Person                 Term labor symptoms and general obstetric precautions including but not limited to vaginal bleeding, contractions, leaking of fluid and fetal movement were reviewed in detail with the patient.   Return in about 1 week (around 05/16/2018) for rob.  [name], CNM 05/09/2018 10:18 AM

## 2018-05-16 ENCOUNTER — Encounter: Payer: Self-pay | Admitting: Maternal Newborn

## 2018-05-16 ENCOUNTER — Ambulatory Visit (INDEPENDENT_AMBULATORY_CARE_PROVIDER_SITE_OTHER): Payer: Medicaid Other | Admitting: Maternal Newborn

## 2018-05-16 VITALS — BP 110/62 | Wt 172.0 lb

## 2018-05-16 DIAGNOSIS — Z3A39 39 weeks gestation of pregnancy: Secondary | ICD-10-CM

## 2018-05-16 DIAGNOSIS — Z3403 Encounter for supervision of normal first pregnancy, third trimester: Secondary | ICD-10-CM

## 2018-05-16 DIAGNOSIS — Z3400 Encounter for supervision of normal first pregnancy, unspecified trimester: Secondary | ICD-10-CM

## 2018-05-16 NOTE — Patient Instructions (Signed)
Labor Induction Scheduled for 05/19/2018 at 800  Labor induction is when steps are taken to cause a pregnant woman to begin the labor process. Most women go into labor on their own between 37 weeks and 42 weeks of pregnancy. When this does not happen or when there is a medical need for labor to begin, steps may be taken to induce labor. Labor induction causes a pregnant woman's uterus to contract. It also causes the cervix to soften (ripen), open (dilate), and thin out (efface). Usually, labor is not induced before 39 weeks of pregnancy unless there is a medical reason to do so. Your health care provider will determine if labor induction is needed. Before inducing labor, your health care provider will consider a number of factors, including:  Your medical condition and your baby's.  How many weeks along you are in your pregnancy.  How mature your baby's lungs are.  The condition of your cervix.  The position of your baby.  The size of your birth canal. What are some reasons for labor induction? Labor may be induced if:  Your health or your baby's health is at risk.  Your pregnancy is overdue by 1 week or more.  Your water breaks but labor does not start on its own.  There is a low amount of amniotic fluid around your baby. You may also choose (elect) to have labor induced at a certain time. Generally, elective labor induction is done no earlier than 39 weeks of pregnancy. What methods are used for labor induction? Methods used for labor induction include:  Prostaglandin medicine. This medicine starts contractions and causes the cervix to dilate and ripen. It can be taken by mouth (orally) or by being inserted into the vagina (suppository).  Inserting a small, thin tube (catheter) with a balloon into the vagina and then expanding the balloon with water to dilate the cervix.  Stripping the membranes. In this method, your health care provider gently separates amniotic sac tissue from  the cervix. This causes the cervix to stretch, which in turn causes the release of a hormone called progesterone. The hormone causes the uterus to contract. This procedure is often done during an office visit, after which you will be sent home to wait for contractions to begin.  Breaking the water. In this method, your health care provider uses a small instrument to make a small hole in the amniotic sac. This eventually causes the amniotic sac to break. Contractions should begin after a few hours.  Medicine to trigger or strengthen contractions. This medicine is given through an IV that is inserted into a vein in your arm. Except for membrane stripping, which can be done in a clinic, labor induction is done in the hospital so that you and your baby can be carefully monitored. How long does it take for labor to be induced? The length of time it takes to induce labor depends on how ready your body is for labor. Some inductions can take up to 2-3 days, while others may take less than a day. Induction may take longer if:  You are induced early in your pregnancy.  It is your first pregnancy.  Your cervix is not ready. What are some risks associated with labor induction? Some risks associated with labor induction include:  Changes in fetal heart rate, such as being too high, too low, or irregular (erratic).  Failed induction.  Infection in the mother or the baby.  Increased risk of having a cesarean delivery.  Fetal  death.  Breaking off (abruption) of the placenta from the uterus (rare).  Rupture of the uterus (very rare). When induction is needed for medical reasons, the benefits of induction generally outweigh the risks. What are some reasons for not inducing labor? Labor induction should not be done if:  Your baby does not tolerate contractions.  You have had previous surgeries on your uterus, such as a myomectomy, removal of fibroids, or a vertical scar from a previous cesarean  delivery.  Your placenta lies very low in your uterus and blocks the opening of the cervix (placenta previa).  Your baby is not in a head-down position.  The umbilical cord drops down into the birth canal in front of the baby.  There are unusual circumstances, such as the baby being very early (premature).  You have had more than 2 previous cesarean deliveries. Summary  Labor induction is when steps are taken to cause a pregnant woman to begin the labor process.  Labor induction causes a pregnant woman's uterus to contract. It also causes the cervix to ripen, dilate, and efface.  Labor is not induced before 39 weeks of pregnancy unless there is a medical reason to do so.  When induction is needed for medical reasons, the benefits of induction generally outweigh the risks. This information is not intended to replace advice given to you by your health care provider. Make sure you discuss any questions you have with your health care provider. Document Released: 08/26/2006 Document Revised: 05/20/2016 Document Reviewed: 05/20/2016 Elsevier Interactive Patient Education  2019 [employer]

## 2018-05-16 NOTE — Progress Notes (Signed)
    Routine Prenatal Care Visit  Subjective  Madison Lee is a 23 y.o. G1P0 at [redacted]w[redacted]d being seen today for ongoing prenatal care.  She is currently monitored for the following issues for this low-risk pregnancy and has Supervision of normal first pregnancy, antepartum on their problem list.  ----------------------------------------------------------------------------------- Patient reports no complaints.   Contractions: Not present. Vag. Bleeding: None.  Movement: Present. No leaking of fluid.  ----------------------------------------------------------------------------------- The following portions of the patient's history were reviewed and updated as appropriate: allergies, current medications, past family history, past medical history, past social history, past surgical history and problem list. Problem list updated.  Objective  Blood pressure 110/62, weight 172 lb (78 kg), last menstrual period 08/11/2017. Pregravid weight 150 lb (68 kg) Total Weight Gain 22 lb (9.979 kg) Body mass index is 30.47 kg/m.   Urinalysis: Urine dipstick shows negative for glucose, protein . Fetal Status: Fetal Heart Rate (bpm): 148 Fundal Height: 39 cm Movement: Present  Presentation: Vertex  General:  Alert, oriented and cooperative. Patient is in no acute distress.  Skin: Skin is warm and dry. No rash noted.   Cardiovascular: Normal heart rate noted  Respiratory: Normal respiratory effort, no problems with respiration noted  Abdomen: Soft, gravid, appropriate for gestational age. Pain/Pressure: Present     Pelvic:  Cervical exam performed Dilation: 2.5 Effacement (%): 70 Station: -1  Extremities: Normal range of motion.  Edema: None  Mental Status: Normal mood and affect. Normal behavior. Normal judgment and thought content.    Assessment   23 y.o. G1P0 at [redacted]w[redacted]d, EDD 05/18/2018 by Last Menstrual Period presenting for a routine prenatal visit.  Plan   pregnancy1 Problems (from 09/10/17 to  present)    Problem Noted Resolved   Supervision of normal first pregnancy, antepartum 10/26/2017 by Natale Milch, MD No   Overview Addendum 03/01/2018 12:52 PM by Vena Austria, MD      [facility] Prenatal Labs  Dating LMP = 11 wk [place] Blood type: A/Positive/-- (06/07 1632)   Genetic Screen 1 Screen:  negative Antibody:Negative (06/07 1632)  Anatomic [place] Complete Rubella: 3.25 (06/07 1632)  Varicella: Immune  GTT 28 wk: 95 RPR: Non Reactive (06/07 1632)   Rhogam  not indicated HBsAg: Negative (06/07 1632)   TDaP vaccine                       HIV: Non Reactive (06/07 1632)   Flu Shot  12/30/17                              GBS:   Contraception  Pap: NEEDS PAP  CBB     CS/VBAC    Baby Food    Support Person                 Term labor symptoms and general obstetric precautions including but not limited to vaginal bleeding, contractions, leaking of fluid and fetal movement were reviewed.  Please refer to After Visit Summary for other counseling recommendations.   IOL scheduled for 05/19/2018 at [address]  [place], [place] 05/16/2018

## 2018-05-16 NOTE — Progress Notes (Signed)
Obstetrics Admission History & Physical   Planned induction of labor at term  HPI:  23 y.o. G1P0 @ [redacted]w[redacted]d (05/18/2018, by Last Menstrual Period). Admitted on (Not on file):   Patient Active Problem List   Diagnosis Date Noted  . Supervision of normal first pregnancy, antepartum 10/26/2017     Presents for an elective induction of labor at term. She has not had any loss of fluid or vaginal bleeding. Her baby is moving well. She has not been having painful contractions.   Prenatal care at: at [facility]. Pregnancy complicated by none.  ROS: A review of systems was performed and negative, except as stated in the above HPI.  PMHx: History reviewed. No pertinent past medical history. PSHx: History reviewed. No pertinent surgical history. Medications: Prenatal vitamins  Allergies: is allergic to amoxicillin. OBHx:  OB History  Gravida Para Term Preterm AB Living  1            SAB TAB Ectopic Multiple Live Births               # Outcome Date GA Lbr Len/2nd Weight Sex Delivery Anes PTL Lv  1 Current            FHx:  Family History  Problem Relation Age of Onset  . Colon cancer Paternal Aunt    Soc Hx: Never smoker, Alcohol: none and Recreational drug use: none  Objective:   Vitals:   05/16/18 1608  BP: 110/62   Constitutional: Well nourished, well developed female in no acute distress.  HEENT: normal Skin: Warm and dry.  Cardiovascular:Regular rate and rhythm.   Extremity: trace to 1+ bilateral pedal edema Respiratory: Normal respiratory effort Abdomen: gravid, non-tender Neuro: Cranial nerves grossly intact Psych: Alert and Oriented x3. No memory deficits. Normal mood and affect.  MS: normal gait, normal bilateral lower extremity ROM/strength/stability.  Pelvic exam: is not limited by body habitus External Genitalia, Bartholin's glands, Urethra, Skene's glands: within normal limits Vagina: within normal limits and with no blood in the vault Cervix: 2.5/70/-1  FHT via  Doppler [phone]   Perinatal info:  Blood type: A positive Rubella - Immune Varicella - Immune TDaP Given during third trimester of this pregnancy, 03/11/2018 RPR NR / HIV Neg/ HBsAg Neg   Assessment & Plan:   23 y.o. G1P0 @ [redacted]w[redacted]d, Admitted for elective induction of labor at term.   Admit for labor, Observe for cervical change and GBS status negative, treat as needed   The various methods of treatment have been discussed, and after consideration of risks, benefits and other options for treatment, the patient has chosen and consented to a labor induction. Plan is to start Pitocin as cervix is favorable.  Marcelyn Bruins, CNM [facility], [facility] 05/16/2018

## 2018-05-16 NOTE — Progress Notes (Signed)
Madison Lee Desires cervical check, wants to talk about IOL

## 2018-05-18 ENCOUNTER — Inpatient Hospital Stay: Admit: 2018-05-18 | Payer: Self-pay

## 2018-05-19 ENCOUNTER — Other Ambulatory Visit: Payer: Self-pay

## 2018-05-19 ENCOUNTER — Inpatient Hospital Stay
Admission: EM | Admit: 2018-05-19 | Discharge: 2018-05-20 | DRG: 807 | Disposition: A | Discharge status: Home / Self Care | Payer: Medicaid Other | Attending: Obstetrics and Gynecology | Admitting: Obstetrics and Gynecology

## 2018-05-19 ENCOUNTER — Inpatient Hospital Stay: Payer: Medicaid Other | Admitting: Anesthesiology

## 2018-05-19 DIAGNOSIS — Z3A40 40 weeks gestation of pregnancy: Secondary | ICD-10-CM | POA: Diagnosis not present

## 2018-05-19 DIAGNOSIS — Z3400 Encounter for supervision of normal first pregnancy, unspecified trimester: Secondary | ICD-10-CM

## 2018-05-19 DIAGNOSIS — Z3A41 41 weeks gestation of pregnancy: Secondary | ICD-10-CM | POA: Diagnosis not present

## 2018-05-19 DIAGNOSIS — Z3490 Encounter for supervision of normal pregnancy, unspecified, unspecified trimester: Secondary | ICD-10-CM | POA: Diagnosis present

## 2018-05-19 DIAGNOSIS — O480 Post-term pregnancy: Secondary | ICD-10-CM | POA: Diagnosis not present

## 2018-05-19 DIAGNOSIS — O26893 Other specified pregnancy related conditions, third trimester: Secondary | ICD-10-CM | POA: Diagnosis present

## 2018-05-19 LAB — CBC
HCT: 29.2 % — ABNORMAL LOW (ref 36.0–46.0)
Hemoglobin: 9.5 g/dL — ABNORMAL LOW (ref 12.0–15.0)
MCH: 27 pg (ref 26.0–34.0)
MCHC: 32.5 g/dL (ref 30.0–36.0)
MCV: 83 fL (ref 80.0–100.0)
Platelets: 205 10*3/uL (ref 150–400)
RBC: 3.52 MIL/uL — ABNORMAL LOW (ref 3.87–5.11)
RDW: 13.7 % (ref 11.5–15.5)
WBC: 8.3 10*3/uL (ref 4.0–10.5)
nRBC: 0 % (ref 0.0–0.2)

## 2018-05-19 LAB — TYPE AND SCREEN
ABO/RH(D): A POS
Antibody Screen: NEGATIVE

## 2018-05-19 MED ORDER — IBUPROFEN 600 MG PO TABS
600.0000 mg | ORAL_TABLET | Freq: Four times a day (QID) | ORAL | Status: DC
  Administered 2018-05-20 (×2): 600 mg via ORAL
  Filled 2018-05-19 (×2): qty 1

## 2018-05-19 MED ORDER — PRENATAL MULTIVITAMIN CH
1.0000 | ORAL_TABLET | Freq: Every day | ORAL | Status: DC
  Administered 2018-05-20: 1 via ORAL
  Filled 2018-05-19: qty 1

## 2018-05-19 MED ORDER — OXYTOCIN BOLUS FROM INFUSION
500.0000 mL | Freq: Once | INTRAVENOUS | Status: AC
  Administered 2018-05-19: 500 mL via INTRAVENOUS

## 2018-05-19 MED ORDER — ONDANSETRON HCL 4 MG/2ML IJ SOLN: 4.0000 mg | INTRAMUSCULAR | Status: DC | PRN

## 2018-05-19 MED ORDER — LIDOCAINE-EPINEPHRINE (PF) 1.5 %-1:200000 IJ SOLN
INTRAMUSCULAR | Status: DC | PRN
  Administered 2018-05-19: 3 mL via PERINEURAL

## 2018-05-19 MED ORDER — BENZOCAINE-MENTHOL 20-0.5 % EX AERO
1.0000 "application " | INHALATION_SPRAY | CUTANEOUS | Status: DC | PRN
  Administered 2018-05-19 – 2018-05-20 (×2): 1 via TOPICAL
  Filled 2018-05-19 (×2): qty 56

## 2018-05-19 MED ORDER — BUPIVACAINE HCL (PF) 0.25 % IJ SOLN
INTRAMUSCULAR | Status: DC | PRN
  Administered 2018-05-19: 5 mL via EPIDURAL
  Administered 2018-05-19: 3 mL via EPIDURAL

## 2018-05-19 MED ORDER — SENNOSIDES-DOCUSATE SODIUM 8.6-50 MG PO TABS
2.0000 | ORAL_TABLET | ORAL | Status: DC
  Administered 2018-05-20: 2 via ORAL
  Filled 2018-05-19: qty 2

## 2018-05-19 MED ORDER — TERBUTALINE SULFATE 1 MG/ML IJ SOLN: 0.2500 mg | Freq: Once | INTRAMUSCULAR | Status: DC | PRN

## 2018-05-19 MED ORDER — MISOPROSTOL 200 MCG PO TABS
ORAL_TABLET | ORAL | Status: AC
  Filled 2018-05-19: qty 4

## 2018-05-19 MED ORDER — SIMETHICONE 80 MG PO CHEW: 80.0000 mg | CHEWABLE_TABLET | ORAL | Status: DC | PRN

## 2018-05-19 MED ORDER — LIDOCAINE HCL (PF) 1 % IJ SOLN
INTRAMUSCULAR | Status: AC
  Filled 2018-05-19: qty 30

## 2018-05-19 MED ORDER — LACTATED RINGERS IV SOLN: 500.0000 mL | Freq: Once | INTRAVENOUS | Status: DC

## 2018-05-19 MED ORDER — ONDANSETRON HCL 4 MG PO TABS: 4.0000 mg | ORAL_TABLET | ORAL | Status: DC | PRN

## 2018-05-19 MED ORDER — PHENYLEPHRINE 40 MCG/ML (10ML) SYRINGE FOR IV PUSH (FOR BLOOD PRESSURE SUPPORT)
80.0000 ug | PREFILLED_SYRINGE | INTRAVENOUS | Status: DC | PRN
  Filled 2018-05-19: qty 10

## 2018-05-19 MED ORDER — WITCH HAZEL-GLYCERIN EX PADS: 1.0000 "application " | MEDICATED_PAD | CUTANEOUS | Status: DC | PRN

## 2018-05-19 MED ORDER — FENTANYL 2.5 MCG/ML W/ROPIVACAINE 0.15% IN NS 100 ML EPIDURAL (ARMC): 12.0000 mL/h | EPIDURAL | Status: DC

## 2018-05-19 MED ORDER — DIBUCAINE 1 % RE OINT: 1.0000 "application " | TOPICAL_OINTMENT | RECTAL | Status: DC | PRN

## 2018-05-19 MED ORDER — LIDOCAINE HCL (PF) 1 % IJ SOLN
INTRAMUSCULAR | Status: DC | PRN
  Administered 2018-05-19: 3 mL

## 2018-05-19 MED ORDER — EPHEDRINE 5 MG/ML INJ
10.0000 mg | INTRAVENOUS | Status: DC | PRN
  Filled 2018-05-19: qty 2

## 2018-05-19 MED ORDER — ACETAMINOPHEN 325 MG PO TABS: 650.0000 mg | ORAL_TABLET | ORAL | Status: DC | PRN

## 2018-05-19 MED ORDER — DIPHENHYDRAMINE HCL 50 MG/ML IJ SOLN: 12.5000 mg | INTRAMUSCULAR | Status: DC | PRN

## 2018-05-19 MED ORDER — FENTANYL 2.5 MCG/ML W/ROPIVACAINE 0.15% IN NS 100 ML EPIDURAL (ARMC)
EPIDURAL | Status: AC
  Filled 2018-05-19: qty 100

## 2018-05-19 MED ORDER — FERROUS SULFATE 325 (65 FE) MG PO TABS
325.0000 mg | ORAL_TABLET | Freq: Two times a day (BID) | ORAL | Status: DC
  Administered 2018-05-20 (×2): 325 mg via ORAL
  Filled 2018-05-19 (×2): qty 1

## 2018-05-19 MED ORDER — ONDANSETRON HCL 4 MG/2ML IJ SOLN: 4.0000 mg | Freq: Four times a day (QID) | INTRAMUSCULAR | Status: DC | PRN

## 2018-05-19 MED ORDER — DIPHENHYDRAMINE HCL 25 MG PO CAPS: 25.0000 mg | ORAL_CAPSULE | Freq: Four times a day (QID) | ORAL | Status: DC | PRN

## 2018-05-19 MED ORDER — HYDROCODONE-ACETAMINOPHEN 5-325 MG PO TABS: 1.0000 | ORAL_TABLET | Freq: Four times a day (QID) | ORAL | Status: DC | PRN

## 2018-05-19 MED ORDER — DOCUSATE SODIUM 100 MG PO CAPS
100.0000 mg | ORAL_CAPSULE | Freq: Two times a day (BID) | ORAL | Status: DC
  Administered 2018-05-20: 100 mg via ORAL
  Filled 2018-05-19: qty 1

## 2018-05-19 MED ORDER — OXYTOCIN 10 UNIT/ML IJ SOLN
INTRAMUSCULAR | Status: AC
  Filled 2018-05-19: qty 2

## 2018-05-19 MED ORDER — FENTANYL 2.5 MCG/ML W/ROPIVACAINE 0.15% IN NS 100 ML EPIDURAL (ARMC)
EPIDURAL | Status: DC | PRN
  Administered 2018-05-19: 12 mL/h via EPIDURAL

## 2018-05-19 MED ORDER — AMMONIA AROMATIC IN INHA
RESPIRATORY_TRACT | Status: AC
  Filled 2018-05-19: qty 10

## 2018-05-19 MED ORDER — LACTATED RINGERS IV SOLN
INTRAVENOUS | Status: DC
  Administered 2018-05-19 (×2): via INTRAVENOUS

## 2018-05-19 MED ORDER — OXYTOCIN 40 UNITS IN NORMAL SALINE INFUSION - SIMPLE MED
1.0000 m[IU]/min | INTRAVENOUS | Status: DC
  Administered 2018-05-19: 2 m[IU]/min via INTRAVENOUS
  Filled 2018-05-19: qty 1000

## 2018-05-19 MED ORDER — LACTATED RINGERS IV SOLN
500.0000 mL | INTRAVENOUS | Status: DC | PRN
  Administered 2018-05-19: 500 mL via INTRAVENOUS

## 2018-05-19 MED ORDER — OXYTOCIN 40 UNITS IN NORMAL SALINE INFUSION - SIMPLE MED: 2.5000 [IU]/h | INTRAVENOUS | Status: DC

## 2018-05-19 MED ORDER — COCONUT OIL OIL
1.0000 "application " | TOPICAL_OIL | Status: DC | PRN
  Administered 2018-05-20: 1 via TOPICAL
  Filled 2018-05-19: qty 120

## 2018-05-19 NOTE — Progress Notes (Signed)
Labor Check  Subj:  Complaints: starting to be a little more uncomfortable with contractions.  Current dose of pitocin is 18 mU/min   Obj:  BP 123/73 (BP Location: Left Arm)   Pulse (!) 102   Temp 98 F (36.7 C) (Oral)   Resp 16   Ht 5\' 3"  (1.6 m)   Wt 78 kg   LMP 08/11/2017   SpO2 100%   BMI 30.47 kg/m  Dose (milli-units/min) Oxytocin: 14 milli-units/min  Cervix: Dilation: 3 / Effacement (%): 70, 80 / Station: -1   AROM: clear fluid Baseline FHR: 130 beats/min   Variability: moderate   Accelerations: present   Decelerations: absent Contractions: present frequency: 3 q 10 min Overall assessment: cat 1  A/P: 23 y.o. G1P0 female at [redacted]w[redacted]d with IOL.  1.  Labor: AROM - clear, reduce pitocin dose to 10 mU/min, increase per protocol from this point. 2.  FWB: reassuring, Overall assessment: category 1  3.  GBS negative   4.  Pain: prn, patient does not think she will want epidural at this point. 5.  Recheck: Q 2 hours Madison Lee   Madison Mohair, MD, [facility], [facility] 05/19/2018 1:28 PM

## 2018-05-19 NOTE — Anesthesia Procedure Notes (Signed)
Epidural Patient location during procedure: OB Start time: 05/19/2018 3:04 PM End time: 05/19/2018 3:22 PM  Staffing Resident/CRNA: Junious Silk, CRNA Performed: resident/CRNA   Preanesthetic Checklist Completed: patient identified, site marked, surgical consent, pre-op evaluation, timeout performed, IV checked, risks and benefits discussed and monitors and equipment checked  Epidural Patient position: sitting Prep: Betadine Patient monitoring: heart rate, continuous pulse ox and blood pressure Approach: midline Location: L3-L4 Injection technique: LOR saline  Needle:  Needle type: Tuohy  Needle gauge: 17 G Needle length: 9 cm and 9 Catheter type: closed end flexible Catheter size: 20 Guage Test dose: negative and 1.5% lidocaine with Epi 1:200 K  Assessment Sensory level: T10 Events: blood not aspirated, injection not painful, no injection resistance, negative IV test and no paresthesia  Additional Notes   Patient tolerated the insertion well without complications.Reason for block:procedure for pain

## 2018-05-19 NOTE — Discharge Summary (Signed)
OB Discharge Summary     Patient Name: Madison MeekMadison L Lee DOB: 10/13/1995 MRN: [id number]  Date of admission: 05/19/2018 Delivering MD: Thomasene MohairStephen Jackson, MD  Date of Delivery: 05/19/2018  Date of discharge: 05/20/2018  Admitting diagnosis: 40 wks  Intrauterine pregnancy: 8264w1d     Secondary diagnosis: None     Discharge diagnosis: Term Pregnancy Delivered                                                                                                Post partum procedures:none  Augmentation: AROM and Pitocin  Complications: None  Hospital course:  Induction of Labor With Vaginal Delivery   23 y.o. yo G1P0 at 6264w1d was admitted to the hospital 05/19/2018 for induction of labor.  Indication for induction: Favorable cervix at term.  Patient had an uncomplicated labor course as follows: Membrane Rupture Time/Date: 1:26 PM ,05/19/2018   Intrapartum Procedures: Episiotomy: None [1]                                         Lacerations:  2nd degree [3];Vaginal [6]  Patient had delivery of a Viable infant.  Information for the patient's newborn:  Nechama GuardGreeson, Girl Ravina [[id number]][[id number]]  Delivery Method: Vag-Spont   05/19/2018  Details of delivery can be found in separate delivery note.  Patient had a routine postpartum course. Patient is discharged home 05/20/18.  Physical exam  Vitals:   05/20/18 0434 05/20/18 0749 05/20/18 1541 05/20/18 1541  BP: 120/82 108/71 116/66 116/66  Pulse: 87 86 78 78  Resp: 18 18 18    Temp: 98.1 F (36.7 C) 98.1 F (36.7 C) 98.1 F (36.7 C) 98.1 F (36.7 C)  TempSrc: Oral Oral Oral   SpO2: 100% 99% 100%   Weight:      Height:       General: alert, cooperative and no distress Lochia: appropriate Uterine Fundus: firm Incision: N/A DVT Evaluation: No evidence of DVT seen on physical exam.  Labs: Lab Results  Component Value Date   WBC 11.3 (H) 05/20/2018   HGB 9.1 (L) 05/20/2018   HCT 28.0 (L) 05/20/2018   MCV 83.6 05/20/2018   PLT 189 05/20/2018     Discharge instruction: per After Visit Summary.  Medications:  Allergies as of 05/20/2018      Reactions   Amoxicillin Hives      Medication List    TAKE these medications   acetaminophen 325 MG tablet Commonly known as:  TYLENOL Take 650 mg by mouth every 6 (six) hours as needed.   prenatal multivitamin Tabs tablet Take 1 tablet by mouth daily at 12 noon.       Diet: routine diet  Activity: Advance as tolerated. Pelvic rest for 6 weeks.   Outpatient follow up: Follow-up Information    Conard NovakJackson, Stephen D, MD. Schedule an appointment as soon as possible for a visit in 6 week(s).   Specialty:  Obstetrics and Gynecology Why:  Please call to schedule your 6 week postpartum follow up appoinment with  Dr. Henderson NewcomerJackson Contact information: [address] [place] [id number] [phone]             Postpartum contraception: Progesterone only pills Rhogam Given postpartum: no Rubella vaccine given postpartum: no Varicella vaccine given postpartum: no TDaP given antepartum or postpartum: AP 03/11/2018 Influenza Vaccine given: 12/30/2017  Newborn Data: Live born female  Birth Weight:  3930 g APGAR: 8, 9  Newborn Delivery   Birth date/time:  05/19/2018 17:52:00 Delivery type:  Vaginal, Spontaneous     Baby Feeding: Breast  Disposition:home with mother  SIGNED: Tresea MallJane Keefe Zawistowski, CNM

## 2018-05-19 NOTE — H&P (Signed)
Date of Initial H&P: 05/16/2018  History reviewed, patient examined, no change in status, here for elective IOL   23 year old G1 P0 with EDC=05/18/2018 presents for elective IOL. Her pregnancy has been uncomplicated and has been remarkable for the following.  [facility] Prenatal Labs  Dating LMP = 11 wk [place] Blood type: A/Positive/-- (06/07 1632)   Genetic Screen 1 Screen:  negative Antibody:Negative (06/07 1632)  Anatomic US Complete Rubella: 3.25 (06/07 1632)  Varicella: Immune  GTT 28 wk: 95 RPR: Non Reactive (06/07 1632)   Rhogam  not indicated HBsAg: Negative (06/07 1632)   TDaP vaccine        03/12/19               HIV: Non Reactive (06/07 1632)   Flu Shot  12/30/17                              GBS: negative  Contraception pills Pap: NEEDS PAP  CBB     CS/VBAC  Total weight gain: 22#  Baby Food Breast   Support Person      Exam:  General: pleasant gravid female in NAD Vital signs: BP 123/73 (BP Location: Left Arm)   Pulse (!) 102   Temp 98 F (36.7 C) (Oral)   Resp 16   Ht 5\' 3"  (1.6 m)   Wt 78 kg   LMP 08/11/2017   SpO2 100%   BMI 30.47 kg/m    Heart: RRR without murmur Lungs: CTAB/ normal respiratory effort Abdomen: cephalic/ LOA/ EFW 7#8oz Cervix: 3/75%/-1  FHR: 150 baseline with accelerations to 170s, moderate variability Toco: no contractions seen  A: IUP at 40wk1 for elective IOL Bishop score: 9 FWB: Cat 1 tracing GBS negative.  P: Discussed IOL using Pitocin. Explained risks of hyperstimulation, fetal intolerance, failure to progress and Cesarean section. Patient verbalizes understanding and wishes to proceed with induction. Anticipate vaginal delivery IVF, CBC, RPR, T&S Clear liquids Stadol for pain relief desired A POS/ RI/ VI TDAP UTD Breast/ pills Farrel Connersolleen Derrius Furtick, CNM

## 2018-05-19 NOTE — Anesthesia Preprocedure Evaluation (Signed)
Anesthesia Evaluation  °Patient identified by MRN, date of birth, ID band °Patient awake ° ° ° °Reviewed: °Allergy & Precautions, H&P , NPO status , Patient's Chart, lab work & pertinent test results ° °History of Anesthesia Complications °Negative for: history of anesthetic complications ° °Airway °Mallampati: II ° °TM Distance: >3 FB °Neck ROM: full ° ° ° Dental °no notable dental hx. ° °  °Pulmonary °neg pulmonary ROS,  °  °Pulmonary exam normal ° ° ° ° ° ° ° Cardiovascular °negative cardio ROS °Normal cardiovascular exam ° ° °  °Neuro/Psych °negative neurological ROS ° negative psych ROS  ° GI/Hepatic °negative GI ROS, Neg liver ROS,   °Endo/Other  °negative endocrine ROS ° Renal/GU °negative Renal ROS  °negative genitourinary °  °Musculoskeletal ° ° Abdominal °  °Peds ° Hematology °negative hematology ROS °(+)   °Anesthesia Other Findings ° ° Reproductive/Obstetrics °(+) Pregnancy ° °  ° ° ° ° ° ° ° ° ° ° ° ° ° °  °  ° ° ° ° ° ° ° ° °Anesthesia Physical °Anesthesia Plan ° °ASA: II ° °Anesthesia Plan: Epidural  ° °Post-op Pain Management:   ° °Induction:  ° °PONV Risk Score and Plan:  ° °Airway Management Planned:  ° °Additional Equipment:  ° °Intra-op Plan:  ° °Post-operative Plan:  ° °Informed Consent: I have reviewed the patients History and Physical, chart, labs and discussed the procedure including the risks, benefits and alternatives for the proposed anesthesia with the patient or authorized representative who has indicated his/her understanding and acceptance.  ° ° ° ° ° °Plan Discussed with: CRNA and Anesthesiologist ° °Anesthesia Plan Comments:   ° ° ° ° ° ° °Anesthesia Quick Evaluation ° °

## 2018-05-20 LAB — CBC
HCT: 28 % — ABNORMAL LOW (ref 36.0–46.0)
Hemoglobin: 9.1 g/dL — ABNORMAL LOW (ref 12.0–15.0)
MCH: 27.2 pg (ref 26.0–34.0)
MCHC: 32.5 g/dL (ref 30.0–36.0)
MCV: 83.6 fL (ref 80.0–100.0)
Platelets: 189 10*3/uL (ref 150–400)
RBC: 3.35 MIL/uL — ABNORMAL LOW (ref 3.87–5.11)
RDW: 13.9 % (ref 11.5–15.5)
WBC: 11.3 10*3/uL — ABNORMAL HIGH (ref 4.0–10.5)
nRBC: 0 % (ref 0.0–0.2)

## 2018-05-20 LAB — RPR: RPR Ser Ql: NONREACTIVE

## 2018-05-20 MED ORDER — NORETHINDRONE 0.35 MG PO TABS: 1.0000 | ORAL_TABLET | Freq: Every day | ORAL | 4 refills | Status: DC

## 2018-05-20 NOTE — Anesthesia Postprocedure Evaluation (Signed)
Anesthesia Post Note  Patient: Madison Lee  Procedure(s) Performed: AN AD HOC LABOR EPIDURAL  Patient location during evaluation: Mother Baby Anesthesia Type: Epidural Level of consciousness: awake and alert Pain management: pain level controlled Vital Signs Assessment: post-procedure vital signs reviewed and stable Respiratory status: spontaneous breathing, nonlabored ventilation and respiratory function stable Cardiovascular status: stable Postop Assessment: no headache, no backache and epidural receding Anesthetic complications: no     Last Vitals:  Vitals:   05/19/18 2340 05/20/18 0434  BP: 119/78 120/82  Pulse: 76 87  Resp: 18 18  Temp: 36.7 C 36.7 C  SpO2: 99% 100%    Last Pain:  Vitals:   05/20/18 0434  TempSrc: Oral  PainSc:                  Rica Mast

## 2018-05-20 NOTE — Discharge Instructions (Signed)
Please call your doctor or return to the ER if you experience any chest pains, shortness of breath, dizziness, visual changes, fever greater than 101, any heavy bleeding (saturating more than 1 pad per hour), large clots, or foul smelling discharge, any worsening abdominal pain and cramping that is not controlled by pain medication, or any signs of postpartum depression. No tampons, enemas, douches, or sexual intercourse for 6 weeks. Also avoid tub baths, hot tubs, or swimming for 6 weeks.  °

## 2018-05-20 NOTE — Progress Notes (Signed)
Post Partum Day 1 Subjective: Doing well, no complaints.  Tolerating regular diet, pain with PO meds, voiding and ambulating without difficulty.  No CP SOB F/C N/V or leg pain No HA, change of vision, RUQ/epigastric pain  Objective: BP 108/71 (BP Location: Right Arm)   Pulse 86   Temp 98.1 F (36.7 C) (Oral)   Resp 18   Ht 5\' 3"  (1.6 m)   Wt 78 kg   LMP 08/11/2017   SpO2 99%   Breastfeeding  BMI 30.47 kg/m    Physical Exam:  General: NAD CV: RRR Pulm: nl effort, CTABL Lochia: moderate Uterine Fundus: fundus firm and below umbilicus DVT Evaluation: no cords, ttp LEs   Recent Labs    05/19/18 0856 05/20/18 0544  HGB 9.5* 9.1*  HCT 29.2* 28.0*  WBC 8.3 11.3*  PLT 205 189    Assessment/Plan: 23 y.o. G1P1001 postpartum day # 1  1. Continue routine postpartum care 2. A positive, Rubella Immune, Varicella Immune 3. TDAP status: given antepartum 4. Breastfeeding/Contraception: progesterone-only pill 5. Disposition: discharge to home tomorrow   [name], CNM

## 2018-05-20 NOTE — Progress Notes (Signed)
Pt discharged with infant. Discharge instructions, prescriptions, and follow up appointments given to and reviewed with patient. Pt verbalized understanding. Escorted out by staff. Geneviene Tesch S Fenton, RN 

## 2018-05-20 NOTE — Lactation Note (Signed)
This note was copied from a baby's chart. Lactation Consultation Note  Patient Name: Madison Lee Today's Date: 05/20/2018 Reason for consult: Initial assessment;[place] Mom wants to try a different position besides cradle, demonstrated football hold on both breasts, mom liked this position, mom is being d/c'd this pm, baby is eager to nurse, mom's questions were answered about burping, length of feedings  Maternal Data Formula Feeding for Exclusion: No Does the patient have breastfeeding experience prior to this delivery?: No  Feeding Feeding Type: Breast Fed  LATCH Score Latch: Grasps breast easily, tongue down, lips flanged, rhythmical sucking.  Audible Swallowing: A few with stimulation  Type of Nipple: Everted at rest and after stimulation  Comfort (Breast/Nipple): Soft / non-tender  Hold (Positioning): Assistance needed to correctly position infant at breast and maintain latch.(in football hold)  LATCH Score: 8  Interventions Interventions: Breast feeding basics reviewed;Assisted with latch;Breast massage;Support pillows;Position options  Lactation Tools Discussed/Used WIC Program: No   Consult Status Consult Status: Complete    Dyann Kief 05/20/2018, 3:31 PM

## 2018-05-20 NOTE — Plan of Care (Signed)
Vs stable; up ad lib; tolerating regular diet; breastfeeding and does need some assistance; pt was able to have iv converted to SL this shift

## 2018-07-01 ENCOUNTER — Other Ambulatory Visit: Payer: Self-pay

## 2018-07-01 ENCOUNTER — Encounter: Payer: Self-pay | Admitting: Obstetrics and Gynecology

## 2018-07-01 ENCOUNTER — Ambulatory Visit (INDEPENDENT_AMBULATORY_CARE_PROVIDER_SITE_OTHER): Payer: Medicaid Other | Admitting: Obstetrics and Gynecology

## 2018-07-01 ENCOUNTER — Other Ambulatory Visit (HOSPITAL_COMMUNITY)
Admission: RE | Admit: 2018-07-01 | Discharge: 2018-07-01 | Disposition: A | Discharge status: Home / Self Care | Payer: Medicaid Other | Source: Ambulatory Visit | Attending: Obstetrics and Gynecology | Admitting: Obstetrics and Gynecology

## 2018-07-01 DIAGNOSIS — Z124 Encounter for screening for malignant neoplasm of cervix: Secondary | ICD-10-CM | POA: Diagnosis not present

## 2018-07-01 NOTE — Progress Notes (Signed)
Postpartum Visit   Chief Complaint  Patient presents with  . Postpartum Care    started taking BC and has noticed it has reduced milk production   History of Present Illness: Patient is a 23 y.o. G1P1001 presents for postpartum visit.  Date of delivery: 05/19/2018 Type of delivery: Vaginal delivery - Vacuum or forceps assisted  no Episiotomy No.  Laceration: yes, 2nd degree  Pregnancy or labor problems:  no Any problems since the delivery:  no  Newborn Details:  SINGLETON :  1. Birth weight: 3,930 grams  Maternal Details:  Breast Feeding:  yes Post partum depression/anxiety noted:  no Edinburgh Post-Partum Depression Score:  7 Date of last PAP:  Never had  Past Medical History:  Diagnosis Date  . Asthma     Past Surgical History:  Procedure Laterality Date  . NO PAST SURGERIES      Prior to Admission medications   Medication Sig Start Date End Date Taking? Authorizing Provider  acetaminophen (TYLENOL) 325 MG tablet Take 650 mg by mouth every 6 (six) hours as needed.   Yes [provider]  norethindrone (MICRONOR,CAMILA,ERRIN) 0.35 MG tablet Take 1 tablet (0.35 mg total) by mouth daily. 06/05/18  Yes [name], CNM  Prenatal Vit-Fe Fumarate-FA (PRENATAL MULTIVITAMIN) TABS tablet Take 1 tablet by mouth daily at 12 noon.   Yes [provider]    Allergies  Allergen Reactions  . Amoxicillin Hives     Social History   Socioeconomic History  . Marital status: Married    Spouse name: Not on file  . Number of children: Not on file  . Years of education: Not on file  . Highest education level: Not on file  Occupational History  . Not on file  Social Needs  . Financial resource strain: Not on file  . Food insecurity:    Worry: Not on file    Inability: Not on file  . Transportation needs:    Medical: Not on file    Non-medical: Not on file  Tobacco Use  . Smoking status: Never Smoker  . Smokeless tobacco: Never Used  Substance and  Sexual Activity  . Alcohol use: Never    Frequency: Never  . Drug use: Never  . Sexual activity: Yes    Birth control/protection: Pill  Lifestyle  . Physical activity:    Days per week: Not on file    Minutes per session: Not on file  . Stress: Not on file  Relationships  . Social connections:    Talks on phone: Not on file    Gets together: Not on file    Attends religious service: Not on file    Active member of club or organization: Not on file    Attends meetings of clubs or organizations: Not on file    Relationship status: Not on file  . Intimate partner violence:    Fear of current or ex partner: Not on file    Emotionally abused: Not on file    Physically abused: Not on file    Forced sexual activity: Not on file  Other Topics Concern  . Not on file  Social History Narrative  . Not on file    Family History  Problem Relation Age of Onset  . Colon cancer Paternal Aunt 26  . Heart disease Paternal Grandfather   . Bone cancer Other   . Breast cancer Other        50s  . Prostate cancer Other  Review of Systems  Constitutional: Negative.   HENT: Negative.   Eyes: Negative.   Respiratory: Negative.   Cardiovascular: Negative.   Gastrointestinal: Negative.   Genitourinary: Negative.   Musculoskeletal: Negative.   Skin: Negative.   Neurological: Negative.   Psychiatric/Behavioral: Negative.      Physical Exam BP 100/60   Ht 5\' 3"  (1.6 m)   Wt 158 lb (71.7 kg)   Breastfeeding Yes   BMI 27.99 kg/m   Physical Exam Constitutional:      General: She is not in acute distress.    Appearance: Normal appearance. She is well-developed.  Genitourinary:     Pelvic exam was performed with patient in the lithotomy position.     Vulva, inguinal canal, urethra, bladder, vagina, uterus, right adnexa and left adnexa normal.     No posterior fourchette tenderness, injury or lesion present.     No cervical friability, lesion, bleeding or polyp.  HENT:     Head:  Normocephalic and atraumatic.  Eyes:     General: No scleral icterus.    Conjunctiva/sclera: Conjunctivae normal.  Neck:     Musculoskeletal: Normal range of motion and neck supple.  Cardiovascular:     Rate and Rhythm: Normal rate and regular rhythm.     Heart sounds: No murmur. No friction rub. No gallop.   Pulmonary:     Effort: Pulmonary effort is normal. No respiratory distress.     Breath sounds: Normal breath sounds. No wheezing or rales.  Abdominal:     General: Bowel sounds are normal. There is no distension.     Palpations: Abdomen is soft. There is no mass.     Tenderness: There is no abdominal tenderness. There is no guarding or rebound.  Musculoskeletal: Normal range of motion.  Neurological:     General: No focal deficit present.     Mental Status: She is alert and oriented to person, place, and time.     Cranial Nerves: No cranial nerve deficit.  Skin:    General: Skin is warm and dry.     Findings: No erythema.  Psychiatric:        Mood and Affect: Mood normal.        Behavior: Behavior normal.        Judgment: Judgment normal.      Female Chaperone present during breast and/or pelvic exam.  Assessment: 23 y.o. G1P1001 presenting for 6 week postpartum visit  Plan: Problem List Items Addressed This Visit    None    Visit Diagnoses    Postpartum care and examination    -  Primary   Relevant Orders   Cytology - PAP   Pap smear for cervical cancer screening       Relevant Orders   Cytology - PAP     1) Contraception Education given regarding options for contraception, including oral contraceptives.  2)  Pap - ASCCP guidelines and rational discussed.  Patient opts for routine screening interval  3) Patient underwent screening for postpartum depression with no concerns noted. SHe scored a 7, but states she is doing well and has no concerns for anxiety or depression  4) Follow up 1 year for routine annual exam  Thomasene Mohair, MD 07/01/2018 12:04 PM

## 2018-07-05 LAB — CYTOLOGY - PAP: Diagnosis: NEGATIVE

## 2018-07-11 ENCOUNTER — Telehealth: Payer: Self-pay

## 2018-07-11 NOTE — Telephone Encounter (Signed)
Pt had pp ck 2w ago.  Returning to work Mon 3/30 and calling to see if she can have work note to NOT be using chemicals since she is breastfeeding.  [phone]

## 2018-07-12 ENCOUNTER — Encounter: Payer: Self-pay | Admitting: Obstetrics and Gynecology

## 2018-07-12 NOTE — Telephone Encounter (Signed)
Lm with pt about letter

## 2018-07-12 NOTE — Telephone Encounter (Signed)
Returning missed call from 10:15 today.

## 2018-07-12 NOTE — Telephone Encounter (Signed)
Returning missed call from 10:15 today.  This encounter was created in error - please disregard.

## 2018-07-12 NOTE — Telephone Encounter (Signed)
Letter written and up front for pt.

## 2018-08-10 ENCOUNTER — Telehealth: Payer: Self-pay

## 2018-08-10 NOTE — Telephone Encounter (Signed)
Pt has questions about bc. [phone] pt states with every back of pills she starts bleeding on the second day of the pack; bleeding is painful.  She would stop pills.  Adv to take pill at the same time every day even if starts bleeding.  Body is adjusting to not being pregnant, bcp, and breastfeeding.

## 2018-11-16 ENCOUNTER — Telehealth: Payer: Self-pay

## 2018-11-16 ENCOUNTER — Other Ambulatory Visit: Payer: Self-pay

## 2018-11-16 ENCOUNTER — Telehealth: Payer: Medicaid Other | Admitting: Nurse Practitioner

## 2018-11-16 DIAGNOSIS — J069 Acute upper respiratory infection, unspecified: Secondary | ICD-10-CM

## 2018-11-16 NOTE — Patient Instructions (Signed)
Shannel, I feel strongly this is an upper respiratory or nonCOVID illness but it's not a bad idea to stir on the side of caution and wait until your test comes back negative. Remember with your history of asthma to take something to help dry up your drainage  If your symptoms worsen or not improved please follow up with the clinic or your PCP for further evaluation and treatment. You should receive your letter via [employer]

## 2018-11-16 NOTE — Telephone Encounter (Signed)
Patient calls our office on advice concerning returning to work.  She experienced headache(pressure) on Monday and Tuesday of this week.  Today she woke up with a sore throat, chest congestion and tightness.  She had a Covid test on Monday that was required by [facility] and is waiting on the results.  Instructed patient not to return to work until she receives her Covid results due to her onset of symptoms.

## 2018-11-16 NOTE — Progress Notes (Signed)
   Subjective:    Patient ID: Madison Lee, female    DOB: 14-Jul-1995, 23 y.o.   MRN: [id number]  Madison Lee is on a telephonic call today and gives consent for treatment. She had a telephone call earlier with reports or head pressure x 2 days, and today developed sore throat, chest congestion and tightness. 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 Unsure when she will be receiving her test results, but wants to be cautious. Found asthma during chart review with no medications. Admits was dx's with asthma in high school "5 years ago" and was dx with cold weather and active induced asthma which resolved in college. No current inhaler. Denies any wheezing or SOB, fever, cough.   Review of Systems  Constitutional: Negative for fatigue and fever.  HENT: Positive for congestion and sore throat. Negative for ear pain.   Respiratory: Positive for chest tightness. Negative for cough, shortness of breath and wheezing.        Objective:   Physical Exam  Virtual visit      Assessment & Plan:

## 2018-12-02 ENCOUNTER — Ambulatory Visit: Payer: Medicaid Other | Admitting: Obstetrics & Gynecology

## 2018-12-09 ENCOUNTER — Other Ambulatory Visit: Payer: Self-pay

## 2018-12-09 ENCOUNTER — Encounter: Payer: Self-pay | Admitting: Obstetrics & Gynecology

## 2018-12-09 ENCOUNTER — Ambulatory Visit (INDEPENDENT_AMBULATORY_CARE_PROVIDER_SITE_OTHER): Payer: Medicaid Other | Admitting: Obstetrics & Gynecology

## 2018-12-09 VITALS — Ht 63.0 in | Wt 153.0 lb

## 2018-12-09 DIAGNOSIS — Z3044 Encounter for surveillance of vaginal ring hormonal contraceptive device: Secondary | ICD-10-CM | POA: Diagnosis not present

## 2018-12-09 MED ORDER — ETONOGESTREL-ETHINYL ESTRADIOL 0.12-0.015 MG/24HR VA RING: VAGINAL_RING | VAGINAL | 11 refills | Status: DC

## 2018-12-09 NOTE — Progress Notes (Signed)
Virtual Visit via Telephone Note  I connected with Madison Lee on 12/09/18 at  3:10 PM EDT by telephone and verified that I am speaking with the correct person using two identifiers.   I discussed the limitations, risks, security and privacy concerns of performing an evaluation and management service by telephone and the availability of in person appointments. I also discussed with the patient that there may be a patient responsible charge related to this service. The patient expressed understanding and agreed to proceed. She was at home and I was in my office.  History of Present Illness: Contraception Counseling Patient presents for contraception counseling. The patient has no complaints today. The patient is sexually active. Pertinent past medical history: none.   She has been on post partum progesterone only pills for 7 mos since delivering in January.  She has resumed periods on this medicine.  She stopped breast feeding 1 weeks ago.  Desires ring.  PMHx: She  has a past medical history of Asthma. Also,  has a past surgical history that includes No past surgeries., family history includes Bone cancer in an other family member; Breast cancer in an other family member; Colon cancer (age of onset: 6354) in her paternal aunt; Heart disease in her paternal grandfather; Prostate cancer in an other family member.,  reports that she has never smoked. She has never used smokeless tobacco. She reports that she does not drink alcohol or use drugs.  She has a current medication list which includes the following prescription(s): acetaminophen, etonogestrel-ethinyl estradiol, and prenatal multivitamin. Also, is allergic to amoxicillin.  Review of Systems  Constitutional: Negative for chills, fever and malaise/fatigue.  HENT: Negative for congestion, sinus pain and sore throat.   Eyes: Negative for blurred vision and pain.  Respiratory: Negative for cough and wheezing.   Cardiovascular: Negative for  chest pain and leg swelling.  Gastrointestinal: Negative for abdominal pain, constipation, diarrhea, heartburn, nausea and vomiting.  Genitourinary: Negative for dysuria, frequency, hematuria and urgency.  Musculoskeletal: Negative for back pain, joint pain, myalgias and neck pain.  Skin: Negative for itching and rash.  Neurological: Negative for dizziness, tremors and weakness.  Endo/Heme/Allergies: Does not bruise/bleed easily.  Psychiatric/Behavioral: Negative for depression. The patient is not nervous/anxious and does not have insomnia.      Observations/Objective: No exam today, due to telephone [facility] and procedures.  Prior visits reviewed along with ultrasounds/labs as indicated.  Assessment and Plan: 1. Encounter for surveillance of vaginal ring hormonal contraceptive device - etonogestrel-ethinyl estradiol (NUVARING) 0.12-0.015 MG/24HR vaginal ring; Insert vaginally and leave in place for 3 consecutive weeks, then remove for 1 week.  Dispense: 1 each; Refill: 11  - All options d/w pt for contraception.    - Plan change pharmacy and Rx to 3 rings per Rx, send out pharmacy ([facility]) once she knows she tolerates it well ([facility] for now)  The risks /benefits of OCPs/Nuvaring have been explained to the patient in detail.  Product literature has been given to her.  I have instructed her in the use of OCPs and have given her literature reinforcing this information.  I have explained to the patient that OCPs are not as effective for birth control during the first month of use, and that another form of contraception should be used during this time.  Both first-day start and Sunday start have been explained.  The risks and benefits of each was discussed.    I have answered  all of her questions, and I believe that she has an understanding of the effectiveness and use of OCPs and Nuvaring (she prefers Nuvaring).  Discussed 24day in 4 day out methology.   She will await next period that is due soon before starting.  Follow Up Instructions: Annual when due, and as needed   I discussed the assessment and treatment plan with the patient. The patient was provided an opportunity to ask questions and all were answered. The patient agreed with the plan and demonstrated an understanding of the instructions.   The patient was advised to call back or seek an in-person evaluation if the symptoms worsen or if the condition fails to improve as anticipated.  I provided 15 minutes of non-face-to-face time during this encounter.   Hoyt Koch, MD

## 2018-12-09 NOTE — Patient Instructions (Signed)
Ethinyl Estradiol; Etonogestrel vaginal ring What is this medicine? ETHINYL ESTRADIOL; ETONOGESTREL (ETH in il es tra DYE ole; et oh noe JES trel) vaginal ring is a flexible, vaginal ring used as a contraceptive (birth control method). This medicine combines 2 types of female hormones, an estrogen and a progestin. This ring is used to prevent ovulation and pregnancy. Each ring is effective for 1 month. This medicine may be used for other purposes; ask your health care provider or pharmacist if you have questions. COMMON BRAND NAME(S): EluRyng, NuvaRing What should I tell my health care provider before I take this medicine? They need to know if you have any of these conditions:  abnormal vaginal bleeding  blood vessel disease or blood clots  breast, cervical, endometrial, ovarian, liver, or uterine cancer  diabetes  gallbladder disease  having surgery  heart disease or recent heart attack  high blood pressure  high cholesterol or triglycerides  history of irregular heartbeat or heart valve problems  kidney disease  liver disease  migraine headaches  protein C deficiency  protein S deficiency  recently had a baby, miscarriage, or abortion  stroke  systemic lupus erythematosus (SLE)  tobacco smoker  your age is more than 22 years old  an unusual or allergic reaction to estrogens, progestins, other medicines, foods, dyes, or preservatives  pregnant or trying to get pregnant  breast-feeding How should I use this medicine? Insert the ring into your vagina as directed. Follow the directions on the prescription label. The ring will remain place for 3 weeks and is then removed for a 1-week break. A new ring is inserted 1 week after the last ring was removed, on the same day of the week. Check often to make sure the ring is still in place. If the ring was out of the vagina for an unknown amount of time, you may not be protected from pregnancy. Perform a pregnancy test and  call your doctor. Do not use more often than directed. A patient package insert for the product will be given with each prescription and refill. Read this sheet carefully each time. The sheet may change frequently. Contact your pediatrician regarding the use of this medicine in children. Special care may be needed. Overdosage: If you think you have taken too much of this medicine contact a poison control center or emergency room at once. NOTE: This medicine is only for you. Do not share this medicine with others. What if I miss a dose? You will need to use the ring exactly as directed. It is very important to follow the schedule every cycle. If you do not use the ring as directed, you may not be protected from pregnancy. If the ring should slip out, is lost, or if you leave it in longer or shorter than you should, contact your health care professional for advice. What may interact with this medicine? Do not take this medicine with the following medications:  dasabuvir; ombitasvir; paritaprevir; ritonavir  ombitasvir; paritaprevir; ritonavir  vaginal lubricants or other vaginal products that are oil-based or silicone-based This medicine may also interact with the following medications:  acetaminophen  antibiotics or medicines for infections, especially rifampin, rifabutin, rifapentine, and griseofulvin, and possibly penicillins or tetracyclines  aprepitant or fosaprepitant  armodafinil  ascorbic acid (vitamin C)  barbiturate medicines, such as phenobarbital or primidone  bosentan  certain antiviral medicines for hepatitis, HIV or AIDS  certain medicines for cancer treatment  certain medicines for seizures like carbamazepine, clobazam, felbamate, lamotrigine, oxcarbazepine, phenytoin,   rufinamide, topiramate  certain medicines for treating high cholesterol  cyclosporine  dantrolene  elagolix  flibanserin  grapefruit juice  lesinurad  medicines for diabetes  medicines  to treat fungal infections, such as griseofulvin, miconazole, fluconazole, ketoconazole, itraconazole, posaconazole or voriconazole  mifepristone  mitotane  modafinil  morphine  mycophenolate  St. John's wort  tamoxifen  temazepam  theophylline or aminophylline  thyroid hormones  tizanidine  tranexamic acid  ulipristal  warfarin This list may not describe all possible interactions. Give your health care provider a list of all the medicines, herbs, non-prescription drugs, or dietary supplements you use. Also tell them if you smoke, drink alcohol, or use illegal drugs. Some items may interact with your medicine. What should I watch for while using this medicine? Visit your doctor or health care professional for regular checks on your progress. You will need a regular breast and pelvic exam and Pap smear while on this medicine. Check with your doctor or health care professional to see if you need an additional method of contraception during the first cycle that you use this ring. Female condoms (made with natural rubber latex, polyisoprene, and polyurethane) and spermicides may be used. Do not use a diaphragm, cervical cap, or a female condom, as the ring can interfere with these birth control methods and their proper placement. If you have any reason to think you are pregnant, stop using this medicine right away and contact your doctor or health care professional. If you are using this medicine for hormone related problems, it may take several cycles of use to see improvement in your condition. Smoking increases the risk of getting a blood clot or having a stroke while you are using hormonal birth control, especially if you are more than 22 years old. You are strongly advised not to smoke. Some women are prone to getting dark patches on the skin of the face (cholasma). Your risk of getting chloasma with this medicine is higher if you had chloasma during a pregnancy. Keep out of the  sun. If you cannot avoid being in the sun, wear protective clothing and use sunscreen. Do not use sun lamps or tanning beds/booths. This medicine can make your body retain fluid, making your fingers, hands, or ankles swell. Your blood pressure can go up. Contact your doctor or health care professional if you feel you are retaining fluid. If you are going to have elective surgery, you may need to stop using this medicine before the surgery. Consult your health care professional for advice. This medicine does not protect you against HIV infection (AIDS) or any other sexually transmitted diseases. What side effects may I notice from receiving this medicine? Side effects that you should report to your doctor or health care professional as soon as possible:  allergic reactions such as skin rash or itching, hives, swelling of the lips, mouth, tongue, or throat  depression  high blood pressure  migraines or severe, sudden headaches  signs and symptoms of a blood clot such as breathing problems; changes in vision; chest pain; severe, sudden headache; pain, swelling, warmth in the leg; trouble speaking; sudden numbness or weakness of the face, arm or leg  signs and symptoms of infection like fever or chills with dizziness and a sunburn-like rash, or pain or trouble passing urine  stomach pain  symptoms of vaginal infection like itching, irritation or unusual discharge  yellowing of the eyes or skin Side effects that usually do not require medical attention (report these to your doctor   or health care professional if they continue or are bothersome):  acne  breast pain, tenderness  irregular vaginal bleeding or spotting, particularly during the first month of use  mild headache  nausea  painful periods  vomiting This list may not describe all possible side effects. Call your doctor for medical advice about side effects. You may report side effects to FDA at 1-800-FDA-1088. Where should I  keep my medicine? Keep out of the reach of children. Store unopened rings in the original foil pouch at room temperature between 20 and 25 degrees C (68 and 77 degrees F) for up to 4 months. Protect from light. Do not store above 30 degrees C (86 degrees F). Throw away any unused medicine after the expiration date. A ring may only be used for 1 cycle (1 month). After the 3-week cycle, a used ring is removed and should be placed in the re-closable foil pouch and discarded in the trash out of reach of children and pets. Do NOT flush down the toilet. NOTE: This sheet is a summary. It may not cover all possible information. If you have questions about this medicine, talk to your doctor, pharmacist, or health care provider.  2020 Elsevier/Gold Standard (2016-12-04 14:41:10)  

## 2018-12-19 ENCOUNTER — Telehealth: Payer: Self-pay | Admitting: Obstetrics & Gynecology

## 2018-12-19 NOTE — Telephone Encounter (Signed)
[employer] calling to see if we can cancel the prior authorizations for generic Nuva ring because she is already has a claim for the Brand name. Please advise

## 2018-12-21 NOTE — Telephone Encounter (Signed)
Its been denied, not sure how to cancel since its already been processed

## 2019-04-17 ENCOUNTER — Ambulatory Visit: Payer: Medicaid Other | Attending: Internal Medicine

## 2019-04-17 ENCOUNTER — Other Ambulatory Visit: Payer: Medicaid Other

## 2019-04-27 DIAGNOSIS — U071 COVID-19: Secondary | ICD-10-CM | POA: Diagnosis not present

## 2019-07-07 ENCOUNTER — Ambulatory Visit: Payer: Medicaid Other | Admitting: Obstetrics & Gynecology

## 2019-07-10 ENCOUNTER — Ambulatory Visit (INDEPENDENT_AMBULATORY_CARE_PROVIDER_SITE_OTHER): Payer: BC Managed Care – PPO | Admitting: Obstetrics & Gynecology

## 2019-07-10 ENCOUNTER — Other Ambulatory Visit: Payer: Self-pay

## 2019-07-10 ENCOUNTER — Other Ambulatory Visit (HOSPITAL_COMMUNITY)
Admission: RE | Admit: 2019-07-10 | Discharge: 2019-07-10 | Disposition: A | Discharge status: Home / Self Care | Payer: BC Managed Care – PPO | Source: Ambulatory Visit | Attending: Obstetrics & Gynecology | Admitting: Obstetrics & Gynecology

## 2019-07-10 ENCOUNTER — Encounter: Payer: Self-pay | Admitting: Obstetrics & Gynecology

## 2019-07-10 VITALS — BP 120/80 | Ht 63.0 in | Wt 151.0 lb

## 2019-07-10 DIAGNOSIS — Z124 Encounter for screening for malignant neoplasm of cervix: Secondary | ICD-10-CM | POA: Diagnosis not present

## 2019-07-10 DIAGNOSIS — Z0000 Encounter for general adult medical examination without abnormal findings: Secondary | ICD-10-CM | POA: Diagnosis not present

## 2019-07-10 DIAGNOSIS — Z01419 Encounter for gynecological examination (general) (routine) without abnormal findings: Secondary | ICD-10-CM | POA: Diagnosis not present

## 2019-07-10 DIAGNOSIS — Z3044 Encounter for surveillance of vaginal ring hormonal contraceptive device: Secondary | ICD-10-CM | POA: Diagnosis not present

## 2019-07-10 MED ORDER — ETONOGESTREL-ETHINYL ESTRADIOL 0.12-0.015 MG/24HR VA RING: VAGINAL_RING | VAGINAL | 3 refills | Status: DC

## 2019-07-10 NOTE — Progress Notes (Signed)
HPI:      [name] is a 24 y.o. G1P1001 who LMP was Patient's last menstrual period was 06/26/2019., she presents today for her annual examination. The patient has no complaints today. The patient is sexually active. Her last pap: approximate date 2020 and was normal. The patient does perform self breast exams.  There is no notable family history of breast or ovarian cancer in her family.  The patient has regular exercise: yes.  The patient denies current symptoms of depression.    GYN History: Contraception: NuvaRing vaginal inserts  PMHx: Past Medical History:  Diagnosis Date  . Asthma    Past Surgical History:  Procedure Laterality Date  . NO PAST SURGERIES     Family History  Problem Relation Age of Onset  . Colon cancer Paternal Aunt 36  . Heart disease Paternal Grandfather   . Bone cancer Other   . Breast cancer Other        50s  . Prostate cancer Other    Social History   Tobacco Use  . Smoking status: Never Smoker  . Smokeless tobacco: Never Used  Substance Use Topics  . Alcohol use: Never  . Drug use: Never    Current Outpatient Medications:  .  acetaminophen (TYLENOL) 325 MG tablet, Take 650 mg by mouth every 6 (six) hours as needed., Disp: , Rfl:  .  etonogestrel-ethinyl estradiol (NUVARING) 0.12-0.015 MG/24HR vaginal ring, Insert vaginally and leave in place for 3 consecutive weeks, then remove for 1 week., Disp: 3 each, Rfl: 3 .  Prenatal Vit-Fe Fumarate-FA (PRENATAL MULTIVITAMIN) TABS tablet, Take 1 tablet by mouth daily at 12 noon., Disp: , Rfl:  Allergies: Amoxicillin  Review of Systems  Constitutional: Negative for chills, fever and malaise/fatigue.  HENT: Negative for congestion, sinus pain and sore throat.   Eyes: Negative for blurred vision and pain.  Respiratory: Negative for cough and wheezing.   Cardiovascular: Negative for chest pain and leg swelling.  Gastrointestinal: Negative for abdominal pain, constipation, diarrhea,  heartburn, nausea and vomiting.  Genitourinary: Negative for dysuria, frequency, hematuria and urgency.  Musculoskeletal: Negative for back pain, joint pain, myalgias and neck pain.  Skin: Negative for itching and rash.  Neurological: Negative for dizziness, tremors and weakness.  Endo/Heme/Allergies: Does not bruise/bleed easily.  Psychiatric/Behavioral: Negative for depression. The patient is not nervous/anxious and does not have insomnia.     Objective: BP 120/80   Ht 5\' 3"  (1.6 m)   Wt 151 lb (68.5 kg)   LMP 06/26/2019   BMI 26.75 kg/m   Filed Weights   07/10/19 1531  Weight: 151 lb (68.5 kg)   Body mass index is 26.75 kg/m. Physical Exam Constitutional:      General: She is not in acute distress.    Appearance: She is well-developed.  Genitourinary:     Pelvic exam was performed with patient supine.     Vagina, uterus and rectum normal.     No lesions in the vagina.     No vaginal bleeding.     No cervical motion tenderness, friability, lesion or polyp.     Uterus is mobile.     Uterus is not enlarged.     No uterine mass detected.    Uterus is midaxial.     No right or left adnexal mass present.     Right adnexa not tender.     Left adnexa not tender.  HENT:     Head: Normocephalic and atraumatic. No  laceration.     Right Ear: Hearing normal.     Left Ear: Hearing normal.     Mouth/Throat:     Pharynx: Uvula midline.  Eyes:     Pupils: Pupils are equal, round, and reactive to light.  Neck:     Thyroid: No thyromegaly.  Cardiovascular:     Rate and Rhythm: Normal rate and regular rhythm.     Heart sounds: No murmur. No friction rub. No gallop.   Pulmonary:     Effort: Pulmonary effort is normal. No respiratory distress.     Breath sounds: Normal breath sounds. No wheezing.  Chest:     Breasts:        Right: No mass, skin change or tenderness.        Left: No mass, skin change or tenderness.  Abdominal:     General: Bowel sounds are normal. There is no  distension.     Palpations: Abdomen is soft.     Tenderness: There is no abdominal tenderness. There is no rebound.  Musculoskeletal:        General: Normal range of motion.     Cervical back: Normal range of motion and neck supple.  Neurological:     Mental Status: She is alert and oriented to person, place, and time.     Cranial Nerves: No cranial nerve deficit.  Skin:    General: Skin is warm and dry.  Psychiatric:        Judgment: Judgment normal.  Vitals reviewed.     Assessment:  ANNUAL EXAM 1. Women's annual routine gynecological examination   2. Encounter for surveillance of vaginal ring hormonal contraceptive device   3. Screening for cervical cancer      Screening Plan:            1.  Cervical Screening-  Pap smear done today  2.  Counseling for contraception: NuvaRing vaginal inserts - etonogestrel-ethinyl estradiol (NUVARING) 0.12-0.015 MG/24HR vaginal ring; Insert vaginally and leave in place for 3 consecutive weeks, then remove for 1 week.  Dispense: 3 each; Refill: 3     F/U  Return in about 1 year (around 07/09/2020) for Annual.  Barnett Applebaum, MD, [facility], [facility] 07/10/2019  4:02 PM

## 2019-07-10 NOTE — Patient Instructions (Signed)
Ethinyl Estradiol; Etonogestrel vaginal ring What is this medicine? ETHINYL ESTRADIOL; ETONOGESTREL (ETH in il es tra DYE ole; et oh noe JES trel) vaginal ring is a flexible, vaginal ring used as a contraceptive (birth control method). This medicine combines 2 types of female hormones, an estrogen and a progestin. This ring is used to prevent ovulation and pregnancy. Each ring is effective for 1 month. This medicine may be used for other purposes; ask your health care provider or pharmacist if you have questions. COMMON BRAND NAME(S): EluRyng, NuvaRing What should I tell my health care provider before I take this medicine? They need to know if you have any of these conditions:  abnormal vaginal bleeding  blood vessel disease or blood clots  breast, cervical, endometrial, ovarian, liver, or uterine cancer  diabetes  gallbladder disease  having surgery  heart disease or recent heart attack  high blood pressure  high cholesterol or triglycerides  history of irregular heartbeat or heart valve problems  kidney disease  liver disease  migraine headaches  protein C deficiency  protein S deficiency  recently had a baby, miscarriage, or abortion  stroke  systemic lupus erythematosus (SLE)  tobacco smoker  your age is more than 23 years old  an unusual or allergic reaction to estrogens, progestins, other medicines, foods, dyes, or preservatives  pregnant or trying to get pregnant  breast-feeding How should I use this medicine? Insert the ring into your vagina as directed. Follow the directions on the prescription label. The ring will remain place for 3 weeks and is then removed for a 1-week break. A new ring is inserted 1 week after the last ring was removed, on the same day of the week. Check often to make sure the ring is still in place. If the ring was out of the vagina for an unknown amount of time, you may not be protected from pregnancy. Perform a pregnancy test and  call your doctor. Do not use more often than directed. A patient package insert for the product will be given with each prescription and refill. Read this sheet carefully each time. The sheet may change frequently. Contact your pediatrician regarding the use of this medicine in children. Special care may be needed. Overdosage: If you think you have taken too much of this medicine contact a poison control center or emergency room at once. NOTE: This medicine is only for you. Do not share this medicine with others. What if I miss a dose? You will need to use the ring exactly as directed. It is very important to follow the schedule every cycle. If you do not use the ring as directed, you may not be protected from pregnancy. If the ring should slip out, is lost, or if you leave it in longer or shorter than you should, contact your health care professional for advice. What may interact with this medicine? Do not take this medicine with the following medications:  dasabuvir; ombitasvir; paritaprevir; ritonavir  ombitasvir; paritaprevir; ritonavir  vaginal lubricants or other vaginal products that are oil-based or silicone-based This medicine may also interact with the following medications:  acetaminophen  antibiotics or medicines for infections, especially rifampin, rifabutin, rifapentine, and griseofulvin, and possibly penicillins or tetracyclines  aprepitant or fosaprepitant  armodafinil  ascorbic acid (vitamin C)  barbiturate medicines, such as phenobarbital or primidone  bosentan  certain antiviral medicines for hepatitis, HIV or AIDS  certain medicines for cancer treatment  certain medicines for seizures like carbamazepine, clobazam, felbamate, lamotrigine, oxcarbazepine, phenytoin,   rufinamide, topiramate  certain medicines for treating high cholesterol  cyclosporine  dantrolene  elagolix  flibanserin  grapefruit juice  lesinurad  medicines for diabetes  medicines  to treat fungal infections, such as griseofulvin, miconazole, fluconazole, ketoconazole, itraconazole, posaconazole or voriconazole  mifepristone  mitotane  modafinil  morphine  mycophenolate  St. John's wort  tamoxifen  temazepam  theophylline or aminophylline  thyroid hormones  tizanidine  tranexamic acid  ulipristal  warfarin This list may not describe all possible interactions. Give your health care provider a list of all the medicines, herbs, non-prescription drugs, or dietary supplements you use. Also tell them if you smoke, drink alcohol, or use illegal drugs. Some items may interact with your medicine. What should I watch for while using this medicine? Visit your doctor or health care professional for regular checks on your progress. You will need a regular breast and pelvic exam and Pap smear while on this medicine. Check with your doctor or health care professional to see if you need an additional method of contraception during the first cycle that you use this ring. Female condoms (made with natural rubber latex, polyisoprene, and polyurethane) and spermicides may be used. Do not use a diaphragm, cervical cap, or a female condom, as the ring can interfere with these birth control methods and their proper placement. If you have any reason to think you are pregnant, stop using this medicine right away and contact your doctor or health care professional. If you are using this medicine for hormone related problems, it may take several cycles of use to see improvement in your condition. Smoking increases the risk of getting a blood clot or having a stroke while you are using hormonal birth control, especially if you are more than 23 years old. You are strongly advised not to smoke. Some women are prone to getting dark patches on the skin of the face (cholasma). Your risk of getting chloasma with this medicine is higher if you had chloasma during a pregnancy. Keep out of the  sun. If you cannot avoid being in the sun, wear protective clothing and use sunscreen. Do not use sun lamps or tanning beds/booths. This medicine can make your body retain fluid, making your fingers, hands, or ankles swell. Your blood pressure can go up. Contact your doctor or health care professional if you feel you are retaining fluid. If you are going to have elective surgery, you may need to stop using this medicine before the surgery. Consult your health care professional for advice. This medicine does not protect you against HIV infection (AIDS) or any other sexually transmitted diseases. What side effects may I notice from receiving this medicine? Side effects that you should report to your doctor or health care professional as soon as possible:  allergic reactions such as skin rash or itching, hives, swelling of the lips, mouth, tongue, or throat  depression  high blood pressure  migraines or severe, sudden headaches  signs and symptoms of a blood clot such as breathing problems; changes in vision; chest pain; severe, sudden headache; pain, swelling, warmth in the leg; trouble speaking; sudden numbness or weakness of the face, arm or leg  signs and symptoms of infection like fever or chills with dizziness and a sunburn-like rash, or pain or trouble passing urine  stomach pain  symptoms of vaginal infection like itching, irritation or unusual discharge  yellowing of the eyes or skin Side effects that usually do not require medical attention (report these to your doctor   or health care professional if they continue or are bothersome):  acne  breast pain, tenderness  irregular vaginal bleeding or spotting, particularly during the first month of use  mild headache  nausea  painful periods  vomiting This list may not describe all possible side effects. Call your doctor for medical advice about side effects. You may report side effects to FDA at 1-800-FDA-1088. Where should I  keep my medicine? Keep out of the reach of children. Store unopened medicine for up to 4 months at room temperature at 15 and 30 degrees C (59 and 86 degrees F). Protect from light. Do not store above 30 degrees C (86 degrees F). Throw away any unused medicine 4 months after the dispense date or the expiration date, whichever comes first. A ring may only be used for 1 cycle (1 month). After the 3-week cycle, a used ring is removed and should be placed in the re-closable foil pouch and discarded in the trash out of reach of children and pets. Do NOT flush down the toilet. NOTE: This sheet is a summary. It may not cover all possible information. If you have questions about this medicine, talk to your doctor, pharmacist, or health care provider.  2020 Elsevier/Gold Standard (2018-10-27 12:31:47)  

## 2019-07-12 LAB — CYTOLOGY - PAP
Chlamydia: NEGATIVE
Comment: NEGATIVE
Comment: NORMAL
Diagnosis: NEGATIVE
Neisseria Gonorrhea: NEGATIVE

## 2019-07-31 DIAGNOSIS — Z03818 Encounter for observation for suspected exposure to other biological agents ruled out: Secondary | ICD-10-CM | POA: Diagnosis not present

## 2019-07-31 DIAGNOSIS — Z20828 Contact with and (suspected) exposure to other viral communicable diseases: Secondary | ICD-10-CM | POA: Diagnosis not present

## 2020-01-01 ENCOUNTER — Encounter: Payer: Self-pay | Admitting: Medical

## 2020-01-01 ENCOUNTER — Other Ambulatory Visit: Payer: Self-pay

## 2020-01-01 ENCOUNTER — Telehealth: Payer: BC Managed Care – PPO | Admitting: Medical

## 2020-01-01 ENCOUNTER — Ambulatory Visit: Payer: BC Managed Care – PPO | Admitting: *Deleted

## 2020-01-01 DIAGNOSIS — J029 Acute pharyngitis, unspecified: Secondary | ICD-10-CM

## 2020-01-01 DIAGNOSIS — Z20822 Contact with and (suspected) exposure to covid-19: Secondary | ICD-10-CM

## 2020-01-01 LAB — POC COVID19 BINAXNOW: SARS Coronavirus 2 Ag: NEGATIVE

## 2020-01-01 MED ORDER — AZITHROMYCIN 250 MG PO TABS: ORAL_TABLET | ORAL | 0 refills | Status: DC

## 2020-01-01 NOTE — Progress Notes (Signed)
° °  Subjective:    Patient ID: Madison Lee, female    DOB: 1995-05-28, 24 y.o.   MRN: [id number]  HPI  24 yo female non acute distress. Soret hroat started yesterday took Ibuprofen felt better.  Tonsils right sided swelling, Right tonsil with white spots.Worse this morning. Denies fever or chills.   Not vaccinated against Covid-19 Allergies  Allergen Reactions   Amoxicillin Hives     Review of Systems  Constitutional: Positive for fatigue. Negative for chills and fever.  HENT: Positive for sore throat. Negative for congestion (some last night, a little bit this am , none now) and ear pain.   Respiratory: Negative for cough and shortness of breath.   Cardiovascular: Negative for chest pain.  Gastrointestinal: Negative for abdominal pain, diarrhea, nausea and vomiting.  Musculoskeletal: Negative for myalgias.  Allergic/Immunologic: Positive for environmental allergies (occasionally when working outside).  Neurological: Positive for headaches (yesterday). Negative for dizziness and light-headedness.       Objective:   Physical Exam  AXOX3 No physical was preformed due to telemedicine appointment.  Uvula midline per patient. Right tonsil swollen with white spots.  Recent Results (from the past 2160 hour(s))  POC COVID-19     Status: Normal   Collection Time: 01/01/20  1:59 PM  Result Value Ref Range   SARS Coronavirus 2 Ag Negative Negative    Comment: Pt aware. Virtual visit completed with H.Puneet Masoner PAC.  talking clearly.    Assessment & Plan:  Pharyngitis Meds ordered this encounter  Medications   azithromycin (ZITHROMAX Z-PAK) 250 MG tablet    Sig: Take 2 tablet by mouth today, then one tablet days 2-5, take with food.    Dispense:  6 each    Refill:  0   Dilute salt water gargles 4 times/day, soft food, OTC Motrin or Tylenol for pain or fever per package directions. Avoid acidic foods like tomato soup or orange juice. 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 Reviewed with patient that if uvula starts to move to one side, concerning for peritonsallar abscess and to seek out medical care at an urgent care or the hospital. Patient verbalizes understanding and has no questions at the end of our conversation.

## 2020-03-07 ENCOUNTER — Telehealth: Payer: Self-pay

## 2020-03-07 ENCOUNTER — Other Ambulatory Visit: Payer: Self-pay | Admitting: Obstetrics & Gynecology

## 2020-03-07 DIAGNOSIS — N939 Abnormal uterine and vaginal bleeding, unspecified: Secondary | ICD-10-CM

## 2020-03-07 NOTE — Telephone Encounter (Signed)
Sch [place] and appt based on change in bleeding pattern on Nuvaring for which she has been on for quite a while.  [place] to be done at [facility] due to staff shortage here, and prior to visit for f/u here.

## 2020-03-07 NOTE — Telephone Encounter (Signed)
Pt calling; has been on period for 3wks the last two of which have been extremely heavy.  [phone]  Pt is on nuvaring; pt had period in Oct; put in a new nuvaring and she started to bleed and has bled since; she decided to take the nuvaring out Mon this week and flow became heavier.  Pt states she is NOT saturating a pad q28min-1hr;  Blood is dark in color.  Pt is cramping and that radiates down her legs.  What to do?

## 2020-03-08 NOTE — Telephone Encounter (Signed)
Patient is scheduled for 03/15/20 for outpatient imaging at [facility] at 4:00. Patient aware out office visit scheduled for 03/18/20 with [facility]

## 2020-03-15 ENCOUNTER — Other Ambulatory Visit: Payer: Self-pay

## 2020-03-15 ENCOUNTER — Ambulatory Visit
Admission: RE | Admit: 2020-03-15 | Discharge: 2020-03-15 | Disposition: A | Discharge status: Home / Self Care | Payer: Medicaid Other | Source: Ambulatory Visit | Attending: Obstetrics & Gynecology | Admitting: Obstetrics & Gynecology

## 2020-03-15 DIAGNOSIS — N939 Abnormal uterine and vaginal bleeding, unspecified: Secondary | ICD-10-CM | POA: Diagnosis not present

## 2020-03-18 ENCOUNTER — Encounter: Payer: Self-pay | Admitting: Obstetrics & Gynecology

## 2020-03-18 ENCOUNTER — Other Ambulatory Visit: Payer: Self-pay

## 2020-03-18 ENCOUNTER — Ambulatory Visit (INDEPENDENT_AMBULATORY_CARE_PROVIDER_SITE_OTHER): Payer: Medicaid Other | Admitting: Obstetrics & Gynecology

## 2020-03-18 VITALS — BP 120/80 | Ht 63.0 in | Wt 148.0 lb

## 2020-03-18 DIAGNOSIS — N920 Excessive and frequent menstruation with regular cycle: Secondary | ICD-10-CM

## 2020-03-18 NOTE — Progress Notes (Signed)
  HPI: Pt is a 24 yo G1P1 on Nuvaring for > 1 year with reg cycles, and then had reg but prolonged 21 day bleedin this month; finally stopped last week; did not use ring this month and then placed new one yesterday.  No other sx's.  No pregnancy.  Ultrasound demonstrates no masses seen, normal  PMHx: She  has a past medical history of Asthma. Also,  has a past surgical history that includes No past surgeries., family history includes Bone cancer in an other family member; Breast cancer in an other family member; Colon cancer (age of onset: 16) in her paternal aunt; Heart disease in her paternal grandfather; Prostate cancer in an other family member.,  reports that she has never smoked. She has never used smokeless tobacco. She reports that she does not drink alcohol and does not use drugs.  She has a current medication list which includes the following prescription(s): etonogestrel-ethinyl estradiol, acetaminophen, azithromycin, and prenatal multivitamin. Also, is allergic to amoxicillin.  Review of Systems  All other systems reviewed and are negative.   Objective: BP 120/80   Ht 5\' 3"  (1.6 m)   Wt 148 lb (67.1 kg)   LMP 02/19/2020   BMI 26.22 kg/m   Physical examination Constitutional NAD, Conversant  Skin No rashes, lesions or ulceration.   Extremities: Moves all appropriately.  Normal ROM for age. No lymphadenopathy.  Neuro: Grossly intact  Psych: Oriented to PPT.  Normal mood. Normal affect.   Review of ULTRASOUND.    I have personally reviewed images and report of recent ultrasound done at [place].    Plan of management to be discussed with patient.  Assessment:  Menorrhagia with regular cycle  See if this becomes a pattern    She has done well w Nuvaring for > 1 year, may be one time rare occurrence    Consider Change to OCP if happens again  A total of 20 minutes were spent face-to-face with the patient as well as preparation, review, communication, and documentation  during this encounter.   [facility], MD, [facility], [facility] 03/18/2020  4:22 PM

## 2020-04-30 DIAGNOSIS — Z03818 Encounter for observation for suspected exposure to other biological agents ruled out: Secondary | ICD-10-CM | POA: Diagnosis not present

## 2020-04-30 DIAGNOSIS — Z20822 Contact with and (suspected) exposure to covid-19: Secondary | ICD-10-CM | POA: Diagnosis not present

## 2020-06-30 ENCOUNTER — Other Ambulatory Visit: Payer: Self-pay | Admitting: Obstetrics & Gynecology

## 2020-06-30 DIAGNOSIS — Z3044 Encounter for surveillance of vaginal ring hormonal contraceptive device: Secondary | ICD-10-CM

## 2020-07-01 NOTE — Telephone Encounter (Signed)
Sch Annual.  Refill done x1

## 2020-09-17 ENCOUNTER — Other Ambulatory Visit (HOSPITAL_COMMUNITY)
Admission: RE | Admit: 2020-09-17 | Discharge: 2020-09-17 | Disposition: A | Discharge status: Home / Self Care | Payer: Medicaid Other | Source: Ambulatory Visit | Attending: Obstetrics | Admitting: Obstetrics

## 2020-09-17 ENCOUNTER — Other Ambulatory Visit: Payer: Self-pay

## 2020-09-17 ENCOUNTER — Ambulatory Visit (INDEPENDENT_AMBULATORY_CARE_PROVIDER_SITE_OTHER): Payer: Medicaid Other | Admitting: Obstetrics

## 2020-09-17 ENCOUNTER — Encounter: Payer: Self-pay | Admitting: Obstetrics

## 2020-09-17 VITALS — BP 106/70 | Wt 151.6 lb

## 2020-09-17 DIAGNOSIS — N926 Irregular menstruation, unspecified: Secondary | ICD-10-CM

## 2020-09-17 DIAGNOSIS — Z3480 Encounter for supervision of other normal pregnancy, unspecified trimester: Secondary | ICD-10-CM | POA: Insufficient documentation

## 2020-09-17 DIAGNOSIS — Z3A01 Less than 8 weeks gestation of pregnancy: Secondary | ICD-10-CM

## 2020-09-17 LAB — POCT URINALYSIS DIPSTICK OB
Glucose, UA: NEGATIVE
POC,PROTEIN,UA: NEGATIVE

## 2020-09-17 LAB — POCT URINE PREGNANCY: Preg Test, Ur: POSITIVE — AB

## 2020-09-17 NOTE — Progress Notes (Signed)
New Obstetric Patient H&P    Chief Complaint: "Desires prenatal care"   History of Present Illness: Patient is a 25 y.o. G2P1001 Not Hispanic or Latino female, LMP 08/02/2020 presents with amenorrhea and positive home pregnancy test. Based on her  LMP, her EDD is Estimated Date of Delivery: 05/08/21 and her EGA is [redacted]w[redacted]d. Cycles are 5. days, regular, and occur approximately every : 28 days. Her last pap smear was about 1 years ago and was no abnormalities.    She had a urine pregnancy test which was positive about 2 week(s)  ago. Her last menstrual period was normal and lasted for  about 5 day(s). Since her LMP she claims she has experienced nausea, breast tenderness and fatigue.. She denies vaginal bleeding. Her past medical history is noncontributory. Her prior pregnancies are notable for none  Since her LMP, she admits to the use of tobacco products  no She claims she has gained   no pounds since the start of her pregnancy.  There are cats in the home in the home  no She admits close contact with children on a regular basis  yes  She has had chicken pox in the past no She has had Tuberculosis exposures, symptoms, or previously tested positive for TB   no Current or past history of domestic violence. no  Genetic Screening/Teratology Counseling: (Includes patient, baby's father, or anyone in either family with:)   1. Patient's age >/= 14 at [facility]  no 2. Thalassemia ([place], [place], Mediterranean, or Asian background): MCV<80  no 3. Neural tube defect (meningomyelocele, spina bifida, anencephaly)  no 4. Congenital heart defect  no  5. Down syndrome  no 6. Tay-Sachs (Jewish, [place]  no 7. Canavan's Disease  no 8. Sickle cell disease or trait (African)  no  9. Hemophilia or other blood disorders  no  10. Muscular dystrophy  no  11. Cystic fibrosis  no  12. Huntington's Chorea  no  13. Mental retardation/autism  no 14. Other inherited genetic or chromosomal disorder   no 15. Maternal metabolic disorder (DM, PKU, etc)  no 16. Patient or FOB with a child with a birth defect not listed above no  16a. Patient or FOB with a birth defect themselves no 17. Recurrent pregnancy loss, or stillbirth  no  18. Any medications since LMP other than prenatal vitamins (include vitamins, supplements, OTC meds, drugs, alcohol)  no 19. Any other genetic/environmental exposure to discuss  no  Infection History:   1. Lives with someone with TB or TB exposed  no  2. Patient or partner has history of genital herpes  no 3. Rash or viral illness since LMP  no 4. History of STI (GC, CT, HPV, syphilis, HIV)  no 5. History of recent travel :  no  Other pertinent information:  no     Review of Systems:10 point review of systems negative unless otherwise noted in HPI  Past Medical History:  Past Medical History:  Diagnosis Date  . Asthma     Past Surgical History:  Past Surgical History:  Procedure Laterality Date  . NO PAST SURGERIES      Gynecologic History: Patient's last menstrual period was 08/01/2020.  Obstetric History: G2P1001  Family History:  Family History  Problem Relation Age of Onset  . Colon cancer Paternal Aunt 73  . Heart disease Paternal Grandfather   . Bone cancer Other   . Breast cancer Other  77s  . Prostate cancer Other     Social History:  Social History   Socioeconomic History  . Marital status: Married    Spouse name: Not on file  . Number of children: Not on file  . Years of education: Not on file  . Highest education level: Not on file  Occupational History  . Not on file  Tobacco Use  . Smoking status: Never Smoker  . Smokeless tobacco: Never Used  Vaping Use  . Vaping Use: Never used  Substance and Sexual Activity  . Alcohol use: Never  . Drug use: Never  . Sexual activity: Yes    Birth control/protection: Pill  Other Topics Concern  . Not on file  Social History Narrative  . Not on file   Social  Determinants of Health   Financial Resource Strain: Not on file  Food Insecurity: Not on file  Transportation Needs: Not on file  Physical Activity: Not on file  Stress: Not on file  Social Connections: Not on file  Intimate Partner Violence: Not on file    Allergies:  Allergies  Allergen Reactions  . Amoxicillin Hives    Medications: Prior to Admission medications   Medication Sig Start Date End Date Taking? Authorizing Provider  Prenatal Vit-Fe Fumarate-FA (PRENATAL MULTIVITAMIN) TABS tablet Take 1 tablet by mouth daily at 12 noon.   Yes [provider]  acetaminophen (TYLENOL) 325 MG tablet Take 650 mg by mouth every 6 (six) hours as needed. Patient not taking: Reported on 03/18/2020    [provider]  azithromycin (ZITHROMAX Z-PAK) 250 MG tablet Take 2 tablet by mouth today, then one tablet days 2-5, take with food. Patient not taking: Reported on 03/18/2020 01/01/20   Doy Mince, PA-C  etonogestrel-ethinyl estradiol (NUVARING) 0.12-0.015 MG/24HR vaginal ring INSERT 1 RING VAGINALLY AS DIRECTED. REMOVE AFTER 3 WEEKS & WAIT 7 DAYS BEFORE INSERTING A NEW RING Patient not taking: Reported on 09/17/2020 07/01/20   Nadara Mustard, MD    Physical Exam Vitals: Blood pressure 106/70, weight 151 lb 9.6 oz (68.8 kg), last menstrual period 08/01/2020.  General: NAD HEENT: normocephalic, anicteric Thyroid: no enlargement, no palpable nodules Pulmonary: No increased work of breathing, CTAB Cardiovascular: RRR, distal pulses 2+ Abdomen: NABS, soft, non-tender, non-distended.  Umbilicus without lesions.  No hepatomegaly, splenomegaly or masses palpable. No evidence of hernia  Genitourinary:  External: Normal external female genitalia.  Normal urethral meatus, normal  Bartholin's and Skene's glands.    Vagina: Normal vaginal mucosa, no evidence of prolapse.    Cervix: Grossly normal in appearance, no bleeding  Uterus: anteverted, Non-enlarged, mobile, normal  contour.  No CMT  Adnexa: ovaries non-enlarged, no adnexal masses  Rectal: deferred Extremities: no edema, erythema, or tenderness Neurologic: Grossly intact Psychiatric: mood appropriate, affect full   Assessment: 25 y.o. G2P1001 at [redacted]w[redacted]d presenting to initiate prenatal care  Plan: 1) Avoid alcoholic beverages. 2) Patient encouraged not to smoke.  3) Discontinue the use of all non-medicinal drugs and chemicals.  4) Take prenatal vitamins daily.  5) Nutrition, food safety (fish, cheese advisories, and high nitrite foods) and exercise discussed. 6) Hospital and practice style discussed with cross coverage system.  7) Genetic Screening, such as with 1st Trimester Screening, cell free fetal DNA, AFP testing, and Ultrasound, as well as with amniocentesis and CVS as appropriate, is discussed with patient. At the conclusion of today's visit patient requested genetic testing. She would like the MaternT test 8) Patient is asked about travel to  areas at risk for the Zika virus, and counseled to avoid travel and exposure to mosquitoes or sexual partners who may have themselves been exposed to the virus. Testing is discussed, and will be ordered as appropriate.  RTC in 4 weeks for dating scan with an MD, bloodwork, including the MaternT testing Mirna Mires, CNM  09/17/2020 4:23 PM

## 2020-09-20 LAB — CYTOLOGY - PAP
Adequacy: ABNORMAL
Chlamydia: NEGATIVE
Comment: NEGATIVE
Comment: NEGATIVE
Comment: NORMAL
Neisseria Gonorrhea: NEGATIVE
Trichomonas: NEGATIVE

## 2020-09-27 ENCOUNTER — Other Ambulatory Visit: Payer: Self-pay | Admitting: Obstetrics & Gynecology

## 2020-09-27 DIAGNOSIS — Z3044 Encounter for surveillance of vaginal ring hormonal contraceptive device: Secondary | ICD-10-CM

## 2020-10-17 ENCOUNTER — Other Ambulatory Visit: Payer: Self-pay

## 2020-10-17 ENCOUNTER — Encounter: Payer: Medicaid Other | Admitting: Obstetrics & Gynecology

## 2020-10-17 ENCOUNTER — Other Ambulatory Visit: Payer: Self-pay | Admitting: Obstetrics

## 2020-10-17 ENCOUNTER — Other Ambulatory Visit: Payer: Medicaid Other

## 2020-10-17 DIAGNOSIS — Z3480 Encounter for supervision of other normal pregnancy, unspecified trimester: Secondary | ICD-10-CM | POA: Diagnosis not present

## 2020-10-17 DIAGNOSIS — Z1379 Encounter for other screening for genetic and chromosomal anomalies: Secondary | ICD-10-CM | POA: Diagnosis not present

## 2020-10-17 DIAGNOSIS — Z3A01 Less than 8 weeks gestation of pregnancy: Secondary | ICD-10-CM

## 2020-10-18 LAB — RPR+RH+ABO+RUB AB+AB SCR+CB...
Antibody Screen: NEGATIVE
HIV Screen 4th Generation wRfx: NONREACTIVE
Hematocrit: 39 % (ref 34.0–46.6)
Hemoglobin: 12.6 g/dL (ref 11.1–15.9)
Hepatitis B Surface Ag: NEGATIVE
MCH: 28.4 pg (ref 26.6–33.0)
MCHC: 32.3 g/dL (ref 31.5–35.7)
MCV: 88 fL (ref 79–97)
Platelets: 229 10*3/uL (ref 150–450)
RBC: 4.43 x10E6/uL (ref 3.77–5.28)
RDW: 12.2 % (ref 11.7–15.4)
RPR Ser Ql: NONREACTIVE
Rh Factor: POSITIVE
Rubella Antibodies, IGG: 2.71 index (ref >0.99)
Varicella zoster IgG: 239 index (ref >165)
WBC: 8.2 10*3/uL (ref 3.4–10.8)

## 2020-10-22 LAB — MATERNIT 21 PLUS CORE, BLOOD
Fetal Fraction: 6
Result (T21): NEGATIVE
Trisomy 13 (Patau syndrome): NEGATIVE
Trisomy 18 (Edwards syndrome): NEGATIVE
Trisomy 21 (Down syndrome): NEGATIVE

## 2020-10-24 ENCOUNTER — Other Ambulatory Visit: Payer: Self-pay

## 2020-10-24 ENCOUNTER — Ambulatory Visit (INDEPENDENT_AMBULATORY_CARE_PROVIDER_SITE_OTHER): Payer: Medicaid Other | Admitting: Obstetrics and Gynecology

## 2020-10-24 ENCOUNTER — Encounter: Payer: Self-pay | Admitting: Obstetrics and Gynecology

## 2020-10-24 VITALS — BP 114/79 | Wt 147.0 lb

## 2020-10-24 DIAGNOSIS — Z3A12 12 weeks gestation of pregnancy: Secondary | ICD-10-CM | POA: Diagnosis not present

## 2020-10-24 DIAGNOSIS — Z3481 Encounter for supervision of other normal pregnancy, first trimester: Secondary | ICD-10-CM

## 2020-10-24 DIAGNOSIS — Z3480 Encounter for supervision of other normal pregnancy, unspecified trimester: Secondary | ICD-10-CM

## 2020-10-25 ENCOUNTER — Encounter: Payer: Self-pay | Admitting: Obstetrics and Gynecology

## 2020-10-25 NOTE — Progress Notes (Signed)
Routine Prenatal Care Visit  Subjective  Madison Lee is a 25 y.o. G2P1001 at [redacted]w[redacted]d being seen today for ongoing prenatal care.  She is currently monitored for the following issues for this low-risk pregnancy and has Supervision of other normal pregnancy, antepartum on their problem list.  ----------------------------------------------------------------------------------- Patient reports no complaints.    . Vag. Bleeding: None.   . Leaking Fluid denies.  U/s confirms EDD (see report) ----------------------------------------------------------------------------------- The following portions of the patient's history were reviewed and updated as appropriate: allergies, current medications, past family history, past medical history, past social history, past surgical history and problem list. Problem list updated.  Objective  Blood pressure 114/79, weight 147 lb (66.7 kg), last menstrual period 08/01/2020. Pregravid weight 147 lb (66.7 kg) Total Weight Gain 0 lb (0 kg) Urinalysis: Urine Protein    Urine Glucose    Fetal Status: Fetal Heart Rate (bpm): 153         General:  Alert, oriented and cooperative. Patient is in no acute distress.  Skin: Skin is warm and dry. No rash noted.   Cardiovascular: Normal heart rate noted  Respiratory: Normal respiratory effort, no problems with respiration noted  Abdomen: Soft, gravid, appropriate for gestational age.       Pelvic:  Cervical exam deferred        Extremities: Normal range of motion.     Mental Status: Normal mood and affect. Normal behavior. Normal judgment and thought content.   Assessment   25 y.o. G2P1001 at [redacted]w[redacted]d by  05/08/2021, by Last Menstrual Period presenting for routine prenatal visit  Plan   pregnancy 2 Problems (from 09/17/20 to present)     Problem Noted Resolved   Supervision of other normal pregnancy, antepartum 09/17/2020 by Mirna Mires, CNM No   Overview Addendum 10/18/2020  5:57 PM by Mirna Mires, CNM      Clinic  Prenatal Labs  Dating  Blood type:   A+  Genetic Screen 1 Screen:    AFP:     Quad:     NIPS: Antibody: neg  Anatomic [place]  Rubella:  immune  GTT Early:               Third trimester:  RPR:   NR  Flu vaccine  HBsAg:   neg  TDaP vaccine                                               Rhogam: HIV:   NR  Baby Food                Breast                               GBS: (For PCN allergy, check sensitivities)  Contraception  Pap:5/31  Circumcision    Pediatrician    Support Person                    Preterm labor symptoms and general obstetric precautions including but not limited to vaginal bleeding, contractions, leaking of fluid and fetal movement were reviewed in detail with the patient. Please refer to After Visit Summary for other counseling recommendations.   Return in about 4 weeks (around 11/21/2020) for Routine Prenatal Appointment.   Thomasene Mohair, MD, [facility], [facility] 10/25/2020 6:07 PM

## 2020-10-25 NOTE — Progress Notes (Signed)
ULTRASOUND REPORT  Location: [facility] Date of Service: 10/25/2020   Indications:dating (transabdominal) Findings:  Singleton intrauterine pregnancy is visualized with a CRL consistent with [redacted]w[redacted]d gestation, giving an (U/S) EDD of 05/08/2021. The (U/S) EDD is consistent with the clinically established EDD of 05/08/2021.  FHR: 153 BPM CRL measurement: 54.2 mm Yolk sac is not visualized. Amnion: not visualized   Right Ovary is not visualized. Left Ovary is not visualized. Corpus luteal cyst:  is not visualized Survey of the adnexa demonstrates no adnexal masses. There is no free peritoneal fluid in the cul de sac.  Impression: 1. [redacted]w[redacted]d Viable Singleton Intrauterine pregnancy by U/S. 2. (U/S) EDD is consistent with Clinically established EDD of 05/08/2021.  There is a viable singleton gestation.  Detailed evaluation of the fetal anatomy is precluded by early gestational age.  It must be noted that a normal ultrasound particular at this early gestational age is unable to rule out fetal aneuploidy, risk of first trimester miscarriage, or anatomic birth defects.  I personally performed this ultrasound and interpreted the images.  Thomasene Mohair, MD, [facility], [facility] 10/25/2020 6:10 PM

## 2020-11-05 LAB — INHERITEST CORE(CF97,SMA,FRAX)

## 2020-11-13 ENCOUNTER — Other Ambulatory Visit: Payer: Self-pay | Admitting: Obstetrics & Gynecology

## 2020-11-22 ENCOUNTER — Encounter: Payer: Self-pay | Admitting: Obstetrics and Gynecology

## 2020-11-22 ENCOUNTER — Ambulatory Visit (INDEPENDENT_AMBULATORY_CARE_PROVIDER_SITE_OTHER): Payer: Medicaid Other | Admitting: Obstetrics and Gynecology

## 2020-11-22 ENCOUNTER — Other Ambulatory Visit: Payer: Self-pay

## 2020-11-22 VITALS — BP 110/68 | Wt 148.0 lb

## 2020-11-22 DIAGNOSIS — Z3482 Encounter for supervision of other normal pregnancy, second trimester: Secondary | ICD-10-CM

## 2020-11-22 DIAGNOSIS — Z3A16 16 weeks gestation of pregnancy: Secondary | ICD-10-CM

## 2020-11-22 LAB — POCT URINALYSIS DIPSTICK OB
Glucose, UA: NEGATIVE
POC,PROTEIN,UA: NEGATIVE

## 2020-11-22 NOTE — Progress Notes (Signed)
Routine Prenatal Care Visit  Subjective  Madison Lee is a 25 y.o. G2P1001 at [redacted]w[redacted]d being seen today for ongoing prenatal care.  She is currently monitored for the following issues for this low-risk pregnancy and has Supervision of other normal pregnancy, antepartum on their problem list.  ----------------------------------------------------------------------------------- Patient reports no complaints.    . Vag. Bleeding: None.  Movement: Absent. Leaking Fluid denies.  ----------------------------------------------------------------------------------- The following portions of the patient's history were reviewed and updated as appropriate: allergies, current medications, past family history, past medical history, past social history, past surgical history and problem list. Problem list updated.  Objective  Blood pressure 110/68, weight 148 lb (67.1 kg), last menstrual period 08/01/2020. Pregravid weight 147 lb (66.7 kg) Total Weight Gain 1 lb (0.454 kg) Urinalysis: Urine Protein Negative  Urine Glucose Negative  Fetal Status: Fetal Heart Rate (bpm): 140   Movement: Absent     General:  Alert, oriented and cooperative. Patient is in no acute distress.  Skin: Skin is warm and dry. No rash noted.   Cardiovascular: Normal heart rate noted  Respiratory: Normal respiratory effort, no problems with respiration noted  Abdomen: Soft, gravid, appropriate for gestational age.       Pelvic:  Cervical exam deferred        Extremities: Normal range of motion.     Mental Status: Normal mood and affect. Normal behavior. Normal judgment and thought content.   Assessment   25 y.o. G2P1001 at [redacted]w[redacted]d by  05/08/2021, by Last Menstrual Period presenting for routine prenatal visit  Plan   pregnancy 2 Problems (from 09/17/20 to present)     Problem Noted Resolved   Supervision of other normal pregnancy, antepartum 09/17/2020 by Mirna Mires, CNM No   Overview Addendum 11/06/2020  8:06 PM by  Mirna Mires, CNM     Clinic  Prenatal Labs  Dating  Blood type:   A+  Genetic Screen 1 Screen:    AFP:     Quad:     NIPS:INheritest neg Antibody: neg  Anatomic [place]  Rubella:  immune  GTT Early:               Third trimester:  RPR:   NR  Flu vaccine  HBsAg:   neg  TDaP vaccine                                               Rhogam: HIV:   NR  Baby Food                Breast                               GBS: (For PCN allergy, check sensitivities)  Contraception  Pap:5/31  Circumcision    Pediatrician    Support Person                   Preterm labor symptoms and general obstetric precautions including but not limited to vaginal bleeding, contractions, leaking of fluid and fetal movement were reviewed in detail with the patient. Please refer to After Visit Summary for other counseling recommendations.   - anatomy u/s scheduled   Return in about 4 weeks (around 12/20/2020) for Routine Prenatal Appointment.   Thomasene Mohair, MD, [facility], [facility]  11/22/2020 11:43 AM

## 2020-12-20 ENCOUNTER — Encounter: Payer: Medicaid Other | Admitting: Advanced Practice Midwife

## 2020-12-27 ENCOUNTER — Ambulatory Visit
Admission: RE | Admit: 2020-12-27 | Discharge: 2020-12-27 | Disposition: A | Discharge status: Home / Self Care | Payer: Medicaid Other | Source: Ambulatory Visit | Attending: Obstetrics and Gynecology | Admitting: Obstetrics and Gynecology

## 2020-12-27 ENCOUNTER — Other Ambulatory Visit: Payer: Self-pay

## 2020-12-27 DIAGNOSIS — Z3482 Encounter for supervision of other normal pregnancy, second trimester: Secondary | ICD-10-CM | POA: Diagnosis not present

## 2020-12-27 DIAGNOSIS — O321XX0 Maternal care for breech presentation, not applicable or unspecified: Secondary | ICD-10-CM | POA: Diagnosis not present

## 2020-12-27 DIAGNOSIS — Z3A20 20 weeks gestation of pregnancy: Secondary | ICD-10-CM | POA: Diagnosis not present

## 2020-12-30 ENCOUNTER — Encounter: Payer: Self-pay | Admitting: Advanced Practice Midwife

## 2020-12-30 ENCOUNTER — Other Ambulatory Visit: Payer: Self-pay

## 2020-12-30 ENCOUNTER — Ambulatory Visit (INDEPENDENT_AMBULATORY_CARE_PROVIDER_SITE_OTHER): Payer: Medicaid Other | Admitting: Advanced Practice Midwife

## 2020-12-30 VITALS — BP 100/60 | Wt 154.0 lb

## 2020-12-30 DIAGNOSIS — Z3A21 21 weeks gestation of pregnancy: Secondary | ICD-10-CM

## 2020-12-30 DIAGNOSIS — Z3482 Encounter for supervision of other normal pregnancy, second trimester: Secondary | ICD-10-CM

## 2020-12-30 DIAGNOSIS — Z3480 Encounter for supervision of other normal pregnancy, unspecified trimester: Secondary | ICD-10-CM

## 2020-12-30 DIAGNOSIS — Z23 Encounter for immunization: Secondary | ICD-10-CM

## 2020-12-30 LAB — POCT URINALYSIS DIPSTICK OB
Glucose, UA: NEGATIVE
POC,PROTEIN,UA: NEGATIVE

## 2020-12-30 NOTE — Progress Notes (Signed)
  Routine Prenatal Care Visit  Subjective  Madison Lee is a 25 y.o. G2P1001 at [redacted]w[redacted]d being seen today for ongoing prenatal care.  She is currently monitored for the following issues for this low-risk pregnancy and has Supervision of other normal pregnancy, antepartum on their problem list.  ----------------------------------------------------------------------------------- Patient reports no complaints.  Final report from recent anatomy scan not available. She was told everything looked good. Will follow up as needed. Contractions: Not present. Vag. Bleeding: None.  Movement: Present. Leaking Fluid denies.  ----------------------------------------------------------------------------------- The following portions of the patient's history were reviewed and updated as appropriate: allergies, current medications, past family history, past medical history, past social history, past surgical history and problem list. Problem list updated.  Objective  Blood pressure 100/60, weight 154 lb (69.9 kg), last menstrual period 08/01/2020. Pregravid weight 147 lb (66.7 kg) Total Weight Gain 7 lb (3.175 kg) Urinalysis: Urine Protein Negative  Urine Glucose Negative  Fetal Status: Fetal Heart Rate (bpm): 138   Movement: Present     General:  Alert, oriented and cooperative. Patient is in no acute distress.  Skin: Skin is warm and dry. No rash noted.   Cardiovascular: Normal heart rate noted  Respiratory: Normal respiratory effort, no problems with respiration noted  Abdomen: Soft, gravid, appropriate for gestational age. Pain/Pressure: Absent     Pelvic:  Cervical exam deferred        Extremities: Normal range of motion.  Edema: None  Mental Status: Normal mood and affect. Normal behavior. Normal judgment and thought content.   Assessment   25 y.o. G2P1001 at [redacted]w[redacted]d by  05/08/2021, by Last Menstrual Period presenting for routine prenatal visit  Plan   pregnancy 2 Problems (from 09/17/20 to  present)    Problem Noted Resolved   Supervision of other normal pregnancy, antepartum 09/17/2020 by Mirna Mires, CNM No   Overview Addendum 11/06/2020  8:06 PM by Mirna Mires, CNM     Clinic  Prenatal Labs  Dating  Blood type:   A+  Genetic Screen 1 Screen:    AFP:     Quad:     NIPS:INheritest neg Antibody: neg  Anatomic [place]  Rubella:  immune  GTT Early: NA                Third trimester:  RPR:   NR  Flu vaccine 12/30/20 HBsAg:   neg  TDaP vaccine                                               Rhogam: HIV:   NR  Baby Food                Breast                               GBS: (For PCN allergy, check sensitivities)  Contraception  Pap:5/31  Circumcision    Pediatrician    Support Person                  Preterm labor symptoms and general obstetric precautions including but not limited to vaginal bleeding, contractions, leaking of fluid and fetal movement were reviewed in detail with the patient.   Return in about 4 weeks (around 01/27/2021) for rob.  [name], CNM 12/30/2020 10:33 AM

## 2021-01-02 ENCOUNTER — Other Ambulatory Visit: Payer: Self-pay | Admitting: Obstetrics and Gynecology

## 2021-01-02 DIAGNOSIS — Z3480 Encounter for supervision of other normal pregnancy, unspecified trimester: Secondary | ICD-10-CM

## 2021-01-24 ENCOUNTER — Ambulatory Visit
Admission: RE | Admit: 2021-01-24 | Discharge: 2021-01-24 | Disposition: A | Discharge status: Home / Self Care | Payer: Medicaid Other | Source: Ambulatory Visit | Attending: Obstetrics and Gynecology | Admitting: Obstetrics and Gynecology

## 2021-01-24 ENCOUNTER — Other Ambulatory Visit: Payer: Self-pay

## 2021-01-24 DIAGNOSIS — Z3480 Encounter for supervision of other normal pregnancy, unspecified trimester: Secondary | ICD-10-CM | POA: Diagnosis not present

## 2021-01-24 DIAGNOSIS — Z3492 Encounter for supervision of normal pregnancy, unspecified, second trimester: Secondary | ICD-10-CM | POA: Diagnosis not present

## 2021-01-24 DIAGNOSIS — Z3A25 25 weeks gestation of pregnancy: Secondary | ICD-10-CM | POA: Diagnosis not present

## 2021-01-27 ENCOUNTER — Encounter: Payer: Medicaid Other | Admitting: Obstetrics & Gynecology

## 2021-02-10 ENCOUNTER — Telehealth: Payer: Self-pay

## 2021-02-10 NOTE — Telephone Encounter (Signed)
Pt calling; had to resch appts this week one of which was 28wk labs d/t hsb sick; is worried she will miss having 28wk labs done.  [phone] Pt aware will have lab appt made for the same day and it will be okay.  Adv pt to eat an egg and a meat; avoid waffles, pancakes, pastries, cereal, juice, bread.

## 2021-02-11 ENCOUNTER — Encounter: Payer: Medicaid Other | Admitting: Obstetrics

## 2021-02-17 ENCOUNTER — Ambulatory Visit (INDEPENDENT_AMBULATORY_CARE_PROVIDER_SITE_OTHER): Payer: Medicaid Other | Admitting: Obstetrics

## 2021-02-17 ENCOUNTER — Other Ambulatory Visit: Payer: Self-pay

## 2021-02-17 ENCOUNTER — Other Ambulatory Visit: Payer: Medicaid Other

## 2021-02-17 VITALS — BP 116/70 | Ht 63.0 in | Wt 162.0 lb

## 2021-02-17 DIAGNOSIS — Z3480 Encounter for supervision of other normal pregnancy, unspecified trimester: Secondary | ICD-10-CM

## 2021-02-17 DIAGNOSIS — Z3A28 28 weeks gestation of pregnancy: Secondary | ICD-10-CM

## 2021-02-17 LAB — POCT URINALYSIS DIPSTICK OB
Glucose, UA: NEGATIVE
POC,PROTEIN,UA: NEGATIVE

## 2021-02-17 NOTE — Progress Notes (Signed)
  Routine Prenatal Care Visit  Subjective  Madison Lee is a 25 y.o. G2P1001 at [redacted]w[redacted]d being seen today for ongoing prenatal care.  She is currently monitored for the following issues for this low-risk pregnancy and has Supervision of other normal pregnancy, antepartum on their problem list.  ----------------------------------------------------------------------------------- Patient reports no complaints.   Contractions: Irregular. Vag. Bleeding: None.  Movement: Present. Leaking Fluid denies.  ----------------------------------------------------------------------------------- The following portions of the patient's history were reviewed and updated as appropriate: allergies, current medications, past family history, past medical history, past social history, past surgical history and problem list. Problem list updated.  Objective  Blood pressure 116/70, height 5\' 3"  (1.6 m), weight 162 lb (73.5 kg), last menstrual period 08/01/2020. Pregravid weight 147 lb (66.7 kg) Total Weight Gain 15 lb (6.804 kg) Urinalysis: Urine Protein    Urine Glucose    Fetal Status:     Movement: Present     General:  Alert, oriented and cooperative. Patient is in no acute distress.  Skin: Skin is warm and dry. No rash noted.   Cardiovascular: Normal heart rate noted  Respiratory: Normal respiratory effort, no problems with respiration noted  Abdomen: Soft, gravid, appropriate for gestational age. Pain/Pressure: Present     Pelvic:  Cervical exam deferred        Extremities: Normal range of motion.     Mental Status: Normal mood and affect. Normal behavior. Normal judgment and thought content.   Assessment   25 y.o. G2P1001 at [redacted]w[redacted]d by  05/08/2021, by Last Menstrual Period presenting for routine prenatal visit  Plan   pregnancy 2 Problems (from 09/17/20 to present)    Problem Noted Resolved   Supervision of other normal pregnancy, antepartum 09/17/2020 by 09/19/2020, CNM No   Overview Addendum  12/30/2020 10:38 AM by 03/01/2021, CNM     Clinic  Prenatal Labs  Dating  Blood type:   A+  Genetic Screen 1 Screen:    AFP:     Quad:     NIPS:INheritest neg Antibody: neg  Anatomic [name]  Rubella:  immune  GTT Early: NA             Third trimester:  RPR:   NR  Flu vaccine 12/30/20 HBsAg:   neg  TDaP vaccine          Rhogam: NA HIV:   NR  Baby Food                Breast                               GBS: (For PCN allergy, check sensitivities)  Contraception  Pap:5/31  Circumcision    Pediatrician    Support Person                  Preterm labor symptoms and general obstetric precautions including but not limited to vaginal bleeding, contractions, leaking of fluid and fetal movement were reviewed in detail with the patient. Please refer to After Visit Summary for other counseling recommendations.   Return in about 2 weeks (around 03/03/2021) for return OB, tdap.  03/05/2021, CNM  02/17/2021 10:05 AM

## 2021-02-18 LAB — GLUCOSE TOLERANCE, 1 HOUR: Glucose, 1Hr PP: 126 mg/dL (ref 70–199)

## 2021-03-03 ENCOUNTER — Ambulatory Visit (INDEPENDENT_AMBULATORY_CARE_PROVIDER_SITE_OTHER): Payer: Medicaid Other | Admitting: Obstetrics

## 2021-03-03 ENCOUNTER — Other Ambulatory Visit: Payer: Self-pay

## 2021-03-03 VITALS — BP 122/70 | Wt 167.0 lb

## 2021-03-03 DIAGNOSIS — Z3480 Encounter for supervision of other normal pregnancy, unspecified trimester: Secondary | ICD-10-CM | POA: Diagnosis not present

## 2021-03-03 DIAGNOSIS — Z23 Encounter for immunization: Secondary | ICD-10-CM | POA: Diagnosis not present

## 2021-03-03 DIAGNOSIS — Z3A30 30 weeks gestation of pregnancy: Secondary | ICD-10-CM

## 2021-03-03 NOTE — Addendum Note (Signed)
Addended by: Liliane Shi on: 03/03/2021 11:13 AM   Modules accepted: Orders

## 2021-03-03 NOTE — Progress Notes (Signed)
  Routine Prenatal Care Visit  Subjective  Madison Lee is a 25 y.o. G2P1001 at [redacted]w[redacted]d being seen today for ongoing prenatal care.  She is currently monitored for the following issues for this low-risk pregnancy and has Supervision of other normal pregnancy, antepartum on their problem list.  ----------------------------------------------------------------------------------- Patient reports no complaints.   She has not had Hep C testing this pregnancy  . Vag. Bleeding: None.  Movement: Present. Leaking Fluid denies.  ----------------------------------------------------------------------------------- The following portions of the patient's history were reviewed and updated as appropriate: allergies, current medications, past family history, past medical history, past social history, past surgical history and problem list. Problem list updated.  Objective  Blood pressure 122/70, weight 167 lb (75.8 kg), last menstrual period 08/01/2020. Pregravid weight 147 lb (66.7 kg) Total Weight Gain 20 lb (9.072 kg) Urinalysis: Urine Protein    Urine Glucose    Fetal Status:     Movement: Present     General:  Alert, oriented and cooperative. Patient is in no acute distress.  Skin: Skin is warm and dry. No rash noted.   Cardiovascular: Normal heart rate noted  Respiratory: Normal respiratory effort, no problems with respiration noted  Abdomen: Soft, gravid, appropriate for gestational age. Pain/Pressure: Absent     Pelvic:  Cervical exam deferred        Extremities: Normal range of motion.     Mental Status: Normal mood and affect. Normal behavior. Normal judgment and thought content.   Assessment   25 y.o. G2P1001 at [redacted]w[redacted]d by  05/08/2021, by Last Menstrual Period presenting for routine prenatal visit  Plan   pregnancy 2 Problems (from 09/17/20 to present)    Problem Noted Resolved   Supervision of other normal pregnancy, antepartum 09/17/2020 by Mirna Mires, CNM No   Overview Addendum  02/19/2021  1:48 PM by Mirna Mires, CNM     Clinic  Prenatal Labs  Dating  Blood type:   A+  Genetic Screen 1 Screen:    AFP:     Quad:     NIPS:INheritest neg Antibody: neg  Anatomic [place]  Rubella:  immune  GTT 126 RPR:   NR  Flu vaccine 12/30/20 HBsAg:   neg  TDaP vaccine          Rhogam: NA HIV:   NR  Baby Food                Breast                               GBS: (For PCN allergy, check sensitivities)  Contraception  Pap:5/31  Circumcision    Pediatrician    Support Person                  Preterm labor symptoms and general obstetric precautions including but not limited to vaginal bleeding, contractions, leaking of fluid and fetal movement were reviewed in detail with the patient. Please refer to After Visit Summary for other counseling recommendations.  Tdap today, Hep C drawn.  No follow-ups on file.  Mirna Mires, CNM  03/03/2021 10:15 AM

## 2021-03-03 NOTE — Progress Notes (Signed)
TDAP today. No vb. No lof.  °

## 2021-03-04 LAB — HEPATITIS C ANTIBODY: Hep C Virus Ab: 0.2 s/co ratio (ref 0.0–0.9)

## 2021-03-18 ENCOUNTER — Ambulatory Visit (INDEPENDENT_AMBULATORY_CARE_PROVIDER_SITE_OTHER): Payer: Medicaid Other | Admitting: Licensed Practical Nurse

## 2021-03-18 ENCOUNTER — Other Ambulatory Visit: Payer: Self-pay

## 2021-03-18 ENCOUNTER — Encounter: Payer: Self-pay | Admitting: Licensed Practical Nurse

## 2021-03-18 VITALS — BP 110/70 | Ht 63.0 in | Wt 169.8 lb

## 2021-03-18 DIAGNOSIS — Z3A32 32 weeks gestation of pregnancy: Secondary | ICD-10-CM

## 2021-03-18 DIAGNOSIS — Z3480 Encounter for supervision of other normal pregnancy, unspecified trimester: Secondary | ICD-10-CM

## 2021-03-18 LAB — POCT URINALYSIS DIPSTICK OB: POC,PROTEIN,UA: NEGATIVE

## 2021-03-18 NOTE — Progress Notes (Signed)
Routine Prenatal Care Visit  Subjective  Madison Lee is a 25 y.o. G2P1001 at [redacted]w[redacted]d being seen today for ongoing prenatal care.  She is currently monitored for the following issues for this low-risk pregnancy and has Supervision of other normal pregnancy, antepartum on their problem list.  ----------------------------------------------------------------------------------- Patient reports  Braxton hicks contractions, pelvis press/pain .  Had a run of contractions last week, they went away over time with walking around. Has not tried support belt for pelvic pain.  Birth control options reviewed.  Contractions: Irregular. Vag. Bleeding: None.  Movement: Present. Leaking Fluid denies.  ----------------------------------------------------------------------------------- The following portions of the patient's history were reviewed and updated as appropriate: allergies, current medications, past family history, past medical history, past social history, past surgical history and problem list. Problem list updated.  Objective  Blood pressure 110/70, height 5\' 3"  (1.6 m), weight 169 lb 12.8 oz (77 kg), last menstrual period 08/01/2020. Pregravid weight 147 lb (66.7 kg) Total Weight Gain 22 lb 12.8 oz (10.3 kg) Urinalysis: Urine Protein    Urine Glucose    Fetal Status:     Movement: Present     General:  Alert, oriented and cooperative. Patient is in no acute distress.  Skin: Skin is warm and dry. No rash noted.   Cardiovascular: Normal heart rate noted  Respiratory: Normal respiratory effort, no problems with respiration noted  Abdomen: Soft, gravid, appropriate for gestational age. Pain/Pressure: Present     Pelvic:  Cervical exam deferred        Extremities: Normal range of motion.     Mental Status: Normal mood and affect. Normal behavior. Normal judgment and thought content.   Assessment   25 y.o. G2P1001 at [redacted]w[redacted]d by  05/08/2021, by Last Menstrual Period presenting for routine  prenatal visit  Plan   pregnancy 2 Problems (from 09/17/20 to present)     Problem Noted Resolved   Supervision of other normal pregnancy, antepartum 09/17/2020 by 09/19/2020, CNM No   Overview Addendum 03/18/2021 10:03 AM by 03/20/2021, CNM     Clinic  Prenatal Labs  Dating  Blood type:   A+  Genetic Screen 1 Screen:    AFP:     Quad:     NIPS:INheritest neg Antibody: neg  Anatomic Ellwood Sayers complete Rubella:  immune  GTT 126 RPR:   NR  Flu vaccine 12/30/20 HBsAg:   neg  TDaP vaccine          Rhogam: NA HIV:   NR  Baby Food                Breast                               GBS: (For PCN allergy, check sensitivities)  Contraception POP, ring Pap:5/31  Circumcision yes  Hep C negative  Pediatrician [facility]   Support Person 03/01/21                  Preterm labor symptoms and general obstetric precautions including but not limited to vaginal bleeding, contractions, leaking of fluid and fetal movement were reviewed in detail with the patient. Recommend Maternity support belt for pelvic pressure, try heat or ice for comfort.  consider chiro  Please refer to After Visit Summary for other counseling recommendations.   Return in about 2 weeks (around 04/01/2021) for ROB.  04/03/2021, CNM  Carie Caddy, [facility]  03/18/21  10:06 AM

## 2021-04-01 ENCOUNTER — Other Ambulatory Visit: Payer: Self-pay

## 2021-04-01 ENCOUNTER — Encounter: Payer: Self-pay | Admitting: Licensed Practical Nurse

## 2021-04-01 ENCOUNTER — Ambulatory Visit (INDEPENDENT_AMBULATORY_CARE_PROVIDER_SITE_OTHER): Payer: Medicaid Other | Admitting: Licensed Practical Nurse

## 2021-04-01 VITALS — BP 110/66 | Wt 171.0 lb

## 2021-04-01 DIAGNOSIS — Z3480 Encounter for supervision of other normal pregnancy, unspecified trimester: Secondary | ICD-10-CM

## 2021-04-01 DIAGNOSIS — Z3685 Encounter for antenatal screening for Streptococcus B: Secondary | ICD-10-CM

## 2021-04-01 DIAGNOSIS — Z3A34 34 weeks gestation of pregnancy: Secondary | ICD-10-CM

## 2021-04-01 LAB — POCT URINALYSIS DIPSTICK OB
Glucose, UA: NEGATIVE
POC,PROTEIN,UA: NEGATIVE

## 2021-04-01 NOTE — Progress Notes (Signed)
°  Routine Prenatal Care Visit  Subjective  Madison Lee is a 25 y.o. G2P1001 at [redacted]w[redacted]d being seen today for ongoing prenatal care.  She is currently monitored for the following issues for this low-risk pregnancy and has Supervision of other normal pregnancy, antepartum on their problem list.  ----------------------------------------------------------------------------------- Patient reports pelvic pressure which is relieved with position change. B-H ctx's at night.  Questions answered regarding what to expect in future appointments. Would prefer to have VE at 37 and 39 weeks, IOL in the 40th if she does not spontaneously labor.   .  .   Pincus Large Fluid denies.  ----------------------------------------------------------------------------------- The following portions of the patient's history were reviewed and updated as appropriate: allergies, current medications, past family history, past medical history, past social history, past surgical history and problem list. Problem list updated.  Objective  Blood pressure 110/66, weight 171 lb (77.6 kg), last menstrual period 08/01/2020. Pregravid weight 147 lb (66.7 kg) Total Weight Gain 24 lb (10.9 kg) Urinalysis: Urine Protein Negative  Urine Glucose Negative  Fetal Status:         active   General:  Alert, oriented and cooperative. Patient is in no acute distress.  Skin: Skin is warm and dry. No rash noted.   Cardiovascular: Normal heart rate noted  Respiratory: Normal respiratory effort, no problems with respiration noted  Abdomen: Soft, gravid, appropriate for gestational age.       Pelvic:  Cervical exam deferred        Extremities: Normal range of motion.     Mental Status: Normal mood and affect. Normal behavior. Normal judgment and thought content.   Assessment   25 y.o. G2P1001 at [redacted]w[redacted]d by  05/08/2021, by Last Menstrual Period presenting for routine prenatal visit  Plan   pregnancy 2 Problems (from 09/17/20 to present)     Problem Noted Resolved   Supervision of other normal pregnancy, antepartum 09/17/2020 by Mirna Mires, CNM No   Overview Addendum 03/18/2021 10:03 AM by Ellwood Sayers, CNM     Clinic  Prenatal Labs  Dating  Blood type:   A+  Genetic Screen 1 Screen:    AFP:     Quad:     NIPS:INheritest neg Antibody: neg  Anatomic [place] complete Rubella:  immune  GTT 126 RPR:   NR  Flu vaccine 12/30/20 HBsAg:   neg  TDaP vaccine          Rhogam: NA HIV:   NR  Baby Food                Breast                               GBS: (For PCN allergy, check sensitivities)  Contraception POP, ring Pap:5/31  Circumcision yes  Hep C negative  Pediatrician [facility]   Support Person Onalee Hua                 Preterm labor symptoms and general obstetric precautions including but not limited to vaginal bleeding, contractions, leaking of fluid and fetal movement were reviewed in detail with the patient. Please refer to After Visit Summary for other counseling recommendations.   36 wk labs next visit   Return in about 2 weeks (around 04/15/2021) for ROB 2 weeks then weekly x3 .  Carie Caddy, CNM  Domingo Pulse, [facility]  04/01/21  9:56 AM

## 2021-04-01 NOTE — Progress Notes (Signed)
ROB - pelvic pressure. RM 4

## 2021-04-15 ENCOUNTER — Encounter: Payer: Self-pay | Admitting: Licensed Practical Nurse

## 2021-04-15 ENCOUNTER — Other Ambulatory Visit: Payer: Self-pay

## 2021-04-15 ENCOUNTER — Ambulatory Visit (INDEPENDENT_AMBULATORY_CARE_PROVIDER_SITE_OTHER): Payer: Medicaid Other | Admitting: Licensed Practical Nurse

## 2021-04-15 VITALS — BP 115/70 | Wt 177.6 lb

## 2021-04-15 DIAGNOSIS — Z3A36 36 weeks gestation of pregnancy: Secondary | ICD-10-CM

## 2021-04-15 DIAGNOSIS — Z3480 Encounter for supervision of other normal pregnancy, unspecified trimester: Secondary | ICD-10-CM

## 2021-04-15 DIAGNOSIS — Z3685 Encounter for antenatal screening for Streptococcus B: Secondary | ICD-10-CM

## 2021-04-15 NOTE — Progress Notes (Signed)
°  Routine Prenatal Care Visit  Subjective  Madison Lee is a 25 y.o. G2P1001 at [redacted]w[redacted]d being seen today for ongoing prenatal care.  She is currently monitored for the following issues for this low-risk pregnancy and has Supervision of other normal pregnancy, antepartum on their problem list.  ----------------------------------------------------------------------------------- Patient reports Here with daughter, dealing with pelvic pressure otherwise doing well.  Desires Induction near 40 weeks, will discuss at future apts. Declines VE   Contractions: Irregular. Vag. Bleeding: None.  Movement: Present. Leaking Fluid denies.  ----------------------------------------------------------------------------------- The following portions of the patient's history were reviewed and updated as appropriate: allergies, current medications, past family history, past medical history, past social history, past surgical history and problem list. Problem list updated.  Objective  Blood pressure 115/70, weight 177 lb 9.6 oz (80.6 kg), last menstrual period 08/01/2020. Pregravid weight 147 lb (66.7 kg) Total Weight Gain 30 lb 9.6 oz (13.9 kg) Urinalysis: Urine Protein    Urine Glucose    Fetal Status:     Movement: Present     General:  Alert, oriented and cooperative. Patient is in no acute distress.  Skin: Skin is warm and dry. No rash noted.   Cardiovascular: Normal heart rate noted  Respiratory: Normal respiratory effort, no problems with respiration noted  Abdomen: Soft, gravid, appropriate for gestational age. Pain/Pressure: Present     Pelvic:  Cervical exam deferred        Extremities: Normal range of motion.     Mental Status: Normal mood and affect. Normal behavior. Normal judgment and thought content.   Assessment   25 y.o. G2P1001 at [redacted]w[redacted]d by  05/08/2021, by Last Menstrual Period presenting for routine prenatal visit GBS screening   Plan   pregnancy 2 Problems (from 09/17/20 to present)      Problem Noted Resolved   Supervision of other normal pregnancy, antepartum 09/17/2020 by Mirna Mires, CNM No   Overview Addendum 04/15/2021  9:55 AM by Ellwood Sayers, CNM     [facility]inic [facility] Prenatal Labs  Dating LMP, 12wk [place] Blood type:   A+  Genetic Screen 1 Screen:    AFP:     Quad:     NIPS:INheritest neg Antibody: neg  Anatomic [place] complete Rubella:  immune  GTT 126 RPR:   NR  Flu vaccine 12/30/20 HBsAg:   neg  TDaP vaccine          Rhogam: NA HIV:   NR  Baby Food                Breast                               GBS: (For PCN allergy, check sensitivities)  Contraception POP, ring Pap:5/31  Circumcision yes  Hep C negative  Pediatrician [facility]   Support Person Onalee Hua                  Term labor symptoms and general obstetric precautions including but not limited to vaginal bleeding, contractions, leaking of fluid and fetal movement were reviewed in detail with the patient. Please refer to After Visit Summary for other counseling recommendations.    GBS screening today   Return in about 1 week (around 04/22/2021) for ROB.  Carie Caddy, CNM  Domingo Pulse, [facility]  04/15/21  9:57 AM

## 2021-04-19 LAB — STREP GP B CULTURE+RFLX: Strep Gp B Culture+Rflx: NEGATIVE

## 2021-04-20 NOTE — L&D Delivery Note (Signed)
Delivery Note At 8:19 PM a viable female was delivered via Vaginal, Spontaneous (Presentation:   Occiput Anterior).  APGAR: 8, 9; weight 7 lb 6.9 oz (3370 g).   Placenta status: Spontaneous, Intact.  Cord: 3 vessels with the following complications: None.  Cord pH: N/A  Anesthesia: Epidural Episiotomy: None Lacerations: None Suture Repair:  none Est. Blood Loss (mL): 355  Mom to postpartum.  Baby to Couplet care / Skin to Skin.  Malachy Mood 05/08/2021, 10:27 AM

## 2021-04-22 ENCOUNTER — Other Ambulatory Visit: Payer: Self-pay

## 2021-04-22 ENCOUNTER — Encounter: Payer: Self-pay | Admitting: Licensed Practical Nurse

## 2021-04-22 ENCOUNTER — Ambulatory Visit (INDEPENDENT_AMBULATORY_CARE_PROVIDER_SITE_OTHER): Payer: Medicaid Other | Admitting: Licensed Practical Nurse

## 2021-04-22 VITALS — BP 118/74 | Wt 175.0 lb

## 2021-04-22 DIAGNOSIS — Z3480 Encounter for supervision of other normal pregnancy, unspecified trimester: Secondary | ICD-10-CM

## 2021-04-22 DIAGNOSIS — Z3A37 37 weeks gestation of pregnancy: Secondary | ICD-10-CM

## 2021-04-22 NOTE — Progress Notes (Signed)
Routine Prenatal Care Visit  Subjective  Madison Lee is a 26 y.o. G2P1001 at [redacted]w[redacted]d being seen today for ongoing prenatal care.  She is currently monitored for the following issues for this low-risk pregnancy and has Supervision of other normal pregnancy, antepartum on their problem list.  ----------------------------------------------------------------------------------- Patient reports a few days ago felt tired and weak and had some nausea-flu and COVID negative, feels good today.  Ready for baby, desires IOL between 39 and 40 wks  Contractions: Irregular. Vag. Bleeding: None.  Movement: Present. Leaking Fluid denies.  ----------------------------------------------------------------------------------- The following portions of the patient's history were reviewed and updated as appropriate: allergies, current medications, past family history, past medical history, past social history, past surgical history and problem list. Problem list updated.  Objective  Blood pressure 118/74, weight 175 lb (79.4 kg), last menstrual period 08/01/2020. Pregravid weight 147 lb (66.7 kg) Total Weight Gain 28 lb (12.7 kg) Urinalysis: Urine Protein    Urine Glucose    Fetal Status: Fetal Heart Rate (bpm): 150 Fundal Height: 36 cm Movement: Present  Presentation: Vertex  General:  Alert, oriented and cooperative. Patient is in no acute distress.  Skin: Skin is warm and dry. No rash noted.   Cardiovascular: Normal heart rate noted  Respiratory: Normal respiratory effort, no problems with respiration noted  Abdomen: Soft, gravid, appropriate for gestational age. Pain/Pressure: Absent     Pelvic:  Cervical exam performed      1/40/-3  Extremities: Normal range of motion.     Mental Status: Normal mood and affect. Normal behavior. Normal judgment and thought content.   Assessment   27 y.o. G2P1001 at [redacted]w[redacted]d by  05/08/2021, by Last Menstrual Period presenting for routine prenatal visit  Plan    pregnancy 2 Problems (from 09/17/20 to present)     Problem Noted Resolved   Supervision of other normal pregnancy, antepartum 09/17/2020 by Mirna Mires, CNM No   Overview Addendum 04/22/2021  9:28 AM by Ellwood Sayers, CNM     [facility]inic [facility] Prenatal Labs  Dating LMP, 12wk [place] Blood type: A/Positive/-- (06/30 1434) A+  Genetic Screen 1 Screen:    AFP:     Quad:     NIPS:INheritest neg Antibody:Negative (06/30 1434)neg  Anatomic [place] complete Rubella: 2.71 (06/30 1434)immune  GTT 126 RPR: Non Reactive (06/30 1434) NR  Flu vaccine 12/30/20 HBsAg: Negative (06/30 1434) neg  TDaP vaccine          Rhogam: NA HIV: Non Reactive (06/30 1434) NR  Baby Food                Breast                               POE:UMPNTIRW/-- (12/27 1057)(For PCN allergy, check sensitivities)  Contraception POP, ring Pap:5/31  Circumcision yes  Hep C negative  Pediatrician [facility]   Support Person Onalee Hua                  Term labor symptoms and general obstetric precautions including but not limited to vaginal bleeding, contractions, leaking of fluid and fetal movement were reviewed in detail with the patient. Please refer to After Visit Summary for other counseling recommendations.   Will call on Thursday to schedule IOL for 1/15  Return in about 1 week (around 04/29/2021) for ROB.  Carie Caddy, CNM  Domingo Pulse, [facility]  04/22/21  9:53 AM

## 2021-04-22 NOTE — Progress Notes (Signed)
No vb. No lof.  

## 2021-04-23 ENCOUNTER — Encounter: Payer: Self-pay | Admitting: Licensed Practical Nurse

## 2021-04-23 ENCOUNTER — Other Ambulatory Visit: Payer: Self-pay | Admitting: Licensed Practical Nurse

## 2021-04-23 DIAGNOSIS — Z01812 Encounter for preprocedural laboratory examination: Secondary | ICD-10-CM

## 2021-04-24 ENCOUNTER — Other Ambulatory Visit: Payer: Self-pay | Admitting: Licensed Practical Nurse

## 2021-04-24 DIAGNOSIS — Z3480 Encounter for supervision of other normal pregnancy, unspecified trimester: Secondary | ICD-10-CM

## 2021-04-24 NOTE — Progress Notes (Signed)
°  [facility] INDUCTION ASSESSMENT SCHEDULING Madison CAHOON [birth year] Medical record #: [id number] Phone #:  Home Phone [phone]  Mobile [phone]    Prenatal Provider:[facility] Delivering Group:[facility] Proposed admission date/time:05/04/21 at 0800 Method of induction: FB, Cytotec, Pitocin   Weight: 79.4 kg BMI  5'3" HIV Negative HSV Negative EDC 05/08/2021 based Lee:LMP  Gestational age Lee admission: [redacted]w[redacted]d Gravidity/parity:G2P1001  Cervix Score   0 1 2 3   Position Posterior Midposition Anterior   Consistency Firm Medium Soft   Effacement (%) 0-30 40-50 60-70 >80  Dilation (cm) Closed 1-2 3-4 >5  Babys station -3 -2 -1 +1, +2   Bishop Score:4   Medical induction of labor  select indication(s) below Elective induction ?39 weeks multiparous patient  Medical Indications Adapted from [place] #[id number], Medically Indicated Late Preterm and Early Term Deliveries, 2013.  PLACENTAL / UTERINE ISSUES FETAL ISSUES MATERNAL ISSUES  Placenta previa (36.0-37.6) Isoimmunization (37.0-38.6) Preeclampsia without severe features or gestational HTN (37.0)  Suspected accreta (34.0-35.6) Growth Restriction ([place]) Preeclampsia with severe features (34.0)  Prior classical CD, uterine window, rupture (36.0-37.6) Isolated (38.0-39.6) Chronic HTN (38.0-39.6)  Prior myomectomy (37.0-38.6) Concurrent findings (34.0-37.6) Cholestasis (99991111)  Umbilical vein varix (99991111) Growth Restriction ([place]) Diabetes  Placental abruption (chronic) Di-Di Isolated (36.0-37.6) Pregestational, controlled (39.0)  OBSTETRIC ISSUES Di-Di concurrent findings (32.0-34.6) Pregestational, uncontrolled (37.0-39.0)  Postdates ? (41 weeks) Mo-Di isolated (32.0-34.6) Pregestational, vascular compromise (37.0- 39.0)  PPROM (34.0) Multiple Gestation Gestational, diet controlled (40.0)  Hx of IUFD (39.0 weeks) Di-Di (38.0-38.6) Gestational, med controlled (39.0)  Polyhydramnios,  mild/moderate; SDV 8-16 or AFI 25-35 (39.0) Mo-Di (36.0-37.6) Gestational, uncontrolled (38.0-39.0)  Oligohydramnios (36.0-37.6); MVP <2 cm  For indications not listed above, delivery recommendations from maternal-fetal medicine consultant occurred Lee:   Provider Signature: Jillene Bucks [facility] Scheduled by:: Otho Bellows y RN  Date:04/24/2021 5:42 AM   Call [phone] to finalize the induction date/time  [id number] (07/17)

## 2021-04-29 ENCOUNTER — Ambulatory Visit (INDEPENDENT_AMBULATORY_CARE_PROVIDER_SITE_OTHER): Payer: Medicaid Other | Admitting: Obstetrics

## 2021-04-29 ENCOUNTER — Encounter: Payer: Self-pay | Admitting: Licensed Practical Nurse

## 2021-04-29 ENCOUNTER — Other Ambulatory Visit: Payer: Self-pay

## 2021-04-29 VITALS — BP 110/66 | Wt 174.0 lb

## 2021-04-29 DIAGNOSIS — Z3A38 38 weeks gestation of pregnancy: Secondary | ICD-10-CM

## 2021-04-29 DIAGNOSIS — Z3480 Encounter for supervision of other normal pregnancy, unspecified trimester: Secondary | ICD-10-CM

## 2021-04-29 LAB — POCT URINALYSIS DIPSTICK OB
Glucose, UA: NEGATIVE
POC,PROTEIN,UA: NEGATIVE

## 2021-04-29 NOTE — H&P (Signed)
Obstetrics Admission History & Physical  04/29/2021 - 1:11 PM Primary OBGYN: [facility]  Chief Complaint: Elective Induction   History of Present Illness  26 y.o. G2P1001 @ [redacted]w[redacted]d (Dating: LMP and 12wksUS), with the above CC. Pregnancy uncomplicated.   Madison Lee endorses +FM, denies LOF/VB, has had irregular contractions.   Review of Systems: Positive for contractions.  Otherwise, her 12 point review of systems is negative or as noted in the History of Present Illness.  PMHx:  Past Medical History:  Diagnosis Date   Asthma    PSHx:  Past Surgical History:  Procedure Laterality Date   NO PAST SURGERIES     Medications: (Not in a hospital admission)    Allergies: is allergic to amoxicillin. OBHx:  OB History  Gravida Para Term Preterm AB Living  2 1 1     1   SAB IAB Ectopic Multiple Live Births        0 1    # Outcome Date GA Lbr Len/2nd Weight Sex Delivery Anes PTL Lv  2 Current           1 Term 05/19/18 [redacted]w[redacted]d / 00:37 3930 g F Vag-Spont EPI  LIV    GYNHx: pap inconclusive needs repeat PP History of STIs: No..             FHx:  Family History  Problem Relation Age of Onset   Colon cancer Paternal Aunt 8   Heart disease Paternal Grandfather    Bone cancer Other    Breast cancer Other        15s   Prostate cancer Other    Soc Hx:  Social History   Socioeconomic History   Marital status: Married    Spouse name: Not on file   Number of children: Not on file   Years of education: Not on file   Highest education level: Not on file  Occupational History   Not on file  Tobacco Use   Smoking status: Never   Smokeless tobacco: Never  Vaping Use   Vaping Use: Never used  Substance and Sexual Activity   Alcohol use: Never   Drug use: Never   Sexual activity: Yes    Birth control/protection: Pill  Other Topics Concern   Not on file  Social History Narrative   Not on file   Social Determinants of Health   Financial Resource Strain: Not on file   Food Insecurity: Not on file  Transportation Needs: Not on file  Physical Activity: Not on file  Stress: Not on file  Social Connections: Not on file  Intimate Partner Violence: Not on file    Objective  Normal blood pressures during parental care  LMP 08/01/2020     General: Well nourished, well developed female in no acute distress.  Skin:  Warm and dry.  Cardiovascular: S1, S2 normal, no murmur, rub or gallop, regular rate and rhythm Respiratory:  Clear to auscultation bilateral. Normal respiratory effort Abdomen: gravid, non-tender, EFW 7.5lbs  Neuro/Psych:  Normal mood and affect.   SSE: 1/40/-3 on 04/24/2021   Labs: Ordered/Pending  Perinatal info  A/ Rubella  Immune / Varicella Immune/RPR NR/HIV negative/HepB Surf Ag negative/TDaP:yes /pap needs repeat PP /  ABO, Rh: A/Positive/-- (06/30 1434) Antibody: Negative (06/30 1434) Rubella: 2.71 (06/30 1434) RPR: Non Reactive (06/30 1434)  HBsAg: Negative (06/30 1434)  HIV: Non Reactive (06/30 1434)  01-23-1983-- (12/27 1057) 1 hr Glucola  126 Genetic screening normal Anatomy [birth year]1985 normal   Maternal  Diabetes: No Genetic Screening: Normal Maternal Ultrasounds/Referrals: Normal Fetal Ultrasounds or other Referrals:  None Maternal Substance Abuse:  No Significant Maternal Medications:  None Significant Maternal Lab Results: None   Assessment & Plan  Madison Lee  25 y.o. G2P1001 @ [redacted]w[redacted]d admitted for an elective induction of labor.  *Pregnancy: Uncomplicated  *GBS: Negative  *Analgesia: epidural  * Induction method to decided based on exam on admission   Bennie Chirico, CNM [facility] [place]

## 2021-04-29 NOTE — Progress Notes (Signed)
Routine Prenatal Care Visit  Subjective  Madison Lee is a 26 y.o. G2P1001 at [redacted]w[redacted]d being seen today for ongoing prenatal care.  She is currently monitored for the following issues for this low-risk pregnancy and has Supervision of other normal pregnancy, antepartum on their problem list.  ----------------------------------------------------------------------------------- Patient reports no complaints.  Has IOL set for the 15th. She declines a vag exam today.  Contractions: Irregular. Vag. Bleeding: None.  Movement: Present. Leaking Fluid denies.  ----------------------------------------------------------------------------------- The following portions of the patient's history were reviewed and updated as appropriate: allergies, current medications, past family history, past medical history, past social history, past surgical history and problem list. Problem list updated.  Objective  Blood pressure 110/66, weight 174 lb (78.9 kg), last menstrual period 08/01/2020. Pregravid weight 147 lb (66.7 kg) Total Weight Gain 27 lb (12.2 kg) Urinalysis: Urine Protein Negative  Urine Glucose Negative  Fetal Status:     Movement: Present     General:  Alert, oriented and cooperative. Patient is in no acute distress.  Skin: Skin is warm and dry. No rash noted.   Cardiovascular: Normal heart rate noted  Respiratory: Normal respiratory effort, no problems with respiration noted  Abdomen: Soft, gravid, appropriate for gestational age. Pain/Pressure: Present     Pelvic:  Cervical exam deferred        Extremities: Normal range of motion.  Edema: None  Mental Status: Normal mood and affect. Normal behavior. Normal judgment and thought content.   Assessment   26 y.o. G2P1001 at [redacted]w[redacted]d by  05/08/2021, by Last Menstrual Period presenting for routine prenatal visit  Plan   pregnancy 2 Problems (from 09/17/20 to present)    Problem Noted Resolved   Supervision of other normal pregnancy, antepartum  09/17/2020 by Mirna Mires, CNM No   Overview Addendum 04/22/2021  9:28 AM by Ellwood Sayers, CNM     [facility]inic [facility] Prenatal Labs  Dating LMP, 12wk [place] Blood type: A/Positive/-- (06/30 1434) A+  Genetic Screen 1 Screen:    AFP:     Quad:     NIPS:INheritest neg Antibody:Negative (06/30 1434)neg  Anatomic [place] complete Rubella: 2.71 (06/30 1434)immune  GTT 126 RPR: Non Reactive (06/30 1434) NR  Flu vaccine 12/30/20 HBsAg: Negative (06/30 1434) neg  TDaP vaccine          Rhogam: NA HIV: Non Reactive (06/30 1434) NR  Baby Food                Breast                               [facility]:ENIDPOEU/-- (12/27 1057)(For PCN allergy, check sensitivities)  Contraception POP, ring Pap:5/31  Circumcision yes  Hep C negative  Pediatrician [facility]   Support Person Onalee Hua                 Term labor symptoms and general obstetric precautions including but not limited to vaginal bleeding, contractions, leaking of fluid and fetal movement were reviewed in detail with the patient. Please refer to After Visit Summary for other counseling recommendations.  An order is placed for her COVID testing to be done this Friday. She plans on an epidural. Mirna Mires, CNM  04/29/2021 10:15 AM    Return if symptoms worsen or fail to improve.  Mirna Mires, CNM  04/29/2021 10:07 AM

## 2021-05-02 ENCOUNTER — Other Ambulatory Visit: Payer: Self-pay

## 2021-05-02 ENCOUNTER — Other Ambulatory Visit
Admission: RE | Admit: 2021-05-02 | Discharge: 2021-05-02 | Disposition: A | Discharge status: Home / Self Care | Payer: Medicaid Other | Source: Ambulatory Visit | Attending: Obstetrics | Admitting: Obstetrics

## 2021-05-02 DIAGNOSIS — Z20822 Contact with and (suspected) exposure to covid-19: Secondary | ICD-10-CM | POA: Insufficient documentation

## 2021-05-02 DIAGNOSIS — Z01812 Encounter for preprocedural laboratory examination: Secondary | ICD-10-CM | POA: Diagnosis present

## 2021-05-03 LAB — SARS CORONAVIRUS 2 (TAT 6-24 HRS): SARS Coronavirus 2: NEGATIVE

## 2021-05-04 ENCOUNTER — Inpatient Hospital Stay
Admission: EM | Admit: 2021-05-04 | Discharge: 2021-05-06 | DRG: 806 | Disposition: A | Discharge status: Home / Self Care | Payer: Medicaid Other | Attending: Obstetrics and Gynecology | Admitting: Obstetrics and Gynecology

## 2021-05-04 ENCOUNTER — Encounter: Payer: Self-pay | Admitting: Obstetrics and Gynecology

## 2021-05-04 ENCOUNTER — Inpatient Hospital Stay: Payer: Medicaid Other | Admitting: Anesthesiology

## 2021-05-04 ENCOUNTER — Other Ambulatory Visit: Payer: Self-pay

## 2021-05-04 DIAGNOSIS — O9081 Anemia of the puerperium: Secondary | ICD-10-CM | POA: Diagnosis not present

## 2021-05-04 DIAGNOSIS — D62 Acute posthemorrhagic anemia: Secondary | ICD-10-CM | POA: Diagnosis not present

## 2021-05-04 DIAGNOSIS — Z3480 Encounter for supervision of other normal pregnancy, unspecified trimester: Secondary | ICD-10-CM

## 2021-05-04 DIAGNOSIS — Z3490 Encounter for supervision of normal pregnancy, unspecified, unspecified trimester: Secondary | ICD-10-CM | POA: Diagnosis present

## 2021-05-04 DIAGNOSIS — Z3A39 39 weeks gestation of pregnancy: Secondary | ICD-10-CM | POA: Diagnosis not present

## 2021-05-04 DIAGNOSIS — O26893 Other specified pregnancy related conditions, third trimester: Secondary | ICD-10-CM | POA: Diagnosis not present

## 2021-05-04 LAB — CBC
HCT: 32.7 % — ABNORMAL LOW (ref 36.0–46.0)
Hemoglobin: 11.9 g/dL — ABNORMAL LOW (ref 12.0–15.0)
MCH: 30.8 pg (ref 26.0–34.0)
MCHC: 36.4 g/dL — ABNORMAL HIGH (ref 30.0–36.0)
MCV: 84.7 fL (ref 80.0–100.0)
Platelets: 237 10*3/uL (ref 150–400)
RBC: 3.86 MIL/uL — ABNORMAL LOW (ref 3.87–5.11)
RDW: 13.2 % (ref 11.5–15.5)
WBC: 9.2 10*3/uL (ref 4.0–10.5)
nRBC: 0 % (ref 0.0–0.2)

## 2021-05-04 LAB — TYPE AND SCREEN
ABO/RH(D): A POS
Antibody Screen: NEGATIVE

## 2021-05-04 MED ORDER — BUTORPHANOL TARTRATE 1 MG/ML IJ SOLN: 1.0000 mg | INTRAMUSCULAR | Status: DC | PRN

## 2021-05-04 MED ORDER — SOD CITRATE-CITRIC ACID 500-334 MG/5ML PO SOLN: 30.0000 mL | ORAL | Status: DC | PRN

## 2021-05-04 MED ORDER — LACTATED RINGERS IV SOLN: 500.0000 mL | Freq: Once | INTRAVENOUS | Status: DC

## 2021-05-04 MED ORDER — LIDOCAINE HCL (PF) 1 % IJ SOLN
INTRAMUSCULAR | Status: DC | PRN
  Administered 2021-05-04: 3 mL via SUBCUTANEOUS

## 2021-05-04 MED ORDER — EPHEDRINE 5 MG/ML INJ
10.0000 mg | INTRAVENOUS | Status: DC | PRN
  Filled 2021-05-04: qty 2

## 2021-05-04 MED ORDER — AMMONIA AROMATIC IN INHA
RESPIRATORY_TRACT | Status: AC
  Filled 2021-05-04: qty 10

## 2021-05-04 MED ORDER — LIDOCAINE HCL (PF) 1 % IJ SOLN
30.0000 mL | INTRAMUSCULAR | Status: DC | PRN
  Filled 2021-05-04: qty 30

## 2021-05-04 MED ORDER — DIPHENHYDRAMINE HCL 50 MG/ML IJ SOLN: 12.5000 mg | INTRAMUSCULAR | Status: DC | PRN

## 2021-05-04 MED ORDER — OXYTOCIN BOLUS FROM INFUSION: 333.0000 mL | Freq: Once | INTRAVENOUS | Status: DC

## 2021-05-04 MED ORDER — PHENYLEPHRINE 40 MCG/ML (10ML) SYRINGE FOR IV PUSH (FOR BLOOD PRESSURE SUPPORT)
80.0000 ug | PREFILLED_SYRINGE | INTRAVENOUS | Status: DC | PRN
  Filled 2021-05-04: qty 10

## 2021-05-04 MED ORDER — OXYTOCIN-SODIUM CHLORIDE 30-0.9 UT/500ML-% IV SOLN
2.5000 [IU]/h | INTRAVENOUS | Status: DC
  Filled 2021-05-04: qty 500

## 2021-05-04 MED ORDER — MISOPROSTOL 200 MCG PO TABS
ORAL_TABLET | ORAL | Status: AC
  Filled 2021-05-04: qty 4

## 2021-05-04 MED ORDER — OXYTOCIN-SODIUM CHLORIDE 30-0.9 UT/500ML-% IV SOLN
1.0000 m[IU]/min | INTRAVENOUS | Status: DC
  Filled 2021-05-04: qty 1000

## 2021-05-04 MED ORDER — TERBUTALINE SULFATE 1 MG/ML IJ SOLN: 0.2500 mg | Freq: Once | INTRAMUSCULAR | Status: DC | PRN

## 2021-05-04 MED ORDER — ONDANSETRON HCL 4 MG/2ML IJ SOLN: 4.0000 mg | Freq: Four times a day (QID) | INTRAMUSCULAR | Status: DC | PRN

## 2021-05-04 MED ORDER — OXYTOCIN-SODIUM CHLORIDE 30-0.9 UT/500ML-% IV SOLN
1.0000 m[IU]/min | INTRAVENOUS | Status: DC
  Administered 2021-05-04: 2 m[IU]/min via INTRAVENOUS

## 2021-05-04 MED ORDER — MISOPROSTOL 25 MCG QUARTER TABLET
25.0000 ug | ORAL_TABLET | ORAL | Status: DC | PRN
  Administered 2021-05-04: 25 ug via VAGINAL
  Filled 2021-05-04 (×2): qty 1

## 2021-05-04 MED ORDER — IBUPROFEN 600 MG PO TABS
600.0000 mg | ORAL_TABLET | Freq: Four times a day (QID) | ORAL | Status: DC
  Administered 2021-05-04 – 2021-05-06 (×5): 600 mg via ORAL
  Filled 2021-05-04 (×6): qty 1

## 2021-05-04 MED ORDER — BUPIVACAINE HCL (PF) 0.25 % IJ SOLN
INTRAMUSCULAR | Status: DC | PRN
  Administered 2021-05-04: 5 mL via EPIDURAL
  Administered 2021-05-04: 3 mL via EPIDURAL

## 2021-05-04 MED ORDER — LIDOCAINE-EPINEPHRINE (PF) 1.5 %-1:200000 IJ SOLN
INTRAMUSCULAR | Status: DC | PRN
  Administered 2021-05-04: 4 mL via EPIDURAL

## 2021-05-04 MED ORDER — FENTANYL-BUPIVACAINE-NACL 0.5-0.125-0.9 MG/250ML-% EP SOLN
12.0000 mL/h | EPIDURAL | Status: DC | PRN
  Administered 2021-05-04: 12 mL/h via EPIDURAL
  Filled 2021-05-04: qty 250

## 2021-05-04 MED ORDER — LACTATED RINGERS IV SOLN: 500.0000 mL | INTRAVENOUS | Status: DC | PRN

## 2021-05-04 MED ORDER — LACTATED RINGERS IV SOLN: INTRAVENOUS | Status: DC

## 2021-05-04 MED ORDER — LIDOCAINE HCL (PF) 1 % IJ SOLN
INTRAMUSCULAR | Status: AC
  Filled 2021-05-04: qty 30

## 2021-05-04 MED ORDER — OXYTOCIN 10 UNIT/ML IJ SOLN
INTRAMUSCULAR | Status: AC
  Filled 2021-05-04: qty 2

## 2021-05-04 NOTE — H&P (Signed)
Obstetric H&P   Chief Complaint: Scheduled IOL  Prenatal Care Provider: [facility]  History of Present Illness: 26 y.o. G2P1001 [redacted]w[redacted]d by 05/08/2021, by Last Menstrual Period presenting for elective IOL.  +FM, no LOF, no VB.  Pregnancy uncomplicated.     Pregravid weight 66.7 kg Total Weight Gain 12.3 kg  pregnancy 2 Problems (from 09/17/20 to present)     Problem Noted Resolved   Supervision of other normal pregnancy, antepartum 09/17/2020 by Imagene Riches, CNM No   Overview Addendum 04/22/2021  9:28 AM by Allen Derry, [place] Prenatal Labs  Dating LMP, 12wk [place] Blood type: A/Positive/-- (06/30 1434) A+  Genetic Screen NIPS:Normal XY Inheritest neg Antibody:Negative (06/30 1434)neg  Anatomic [place] complete Rubella: 2.71 (06/30 1434)immune  GTT 126 RPR: Non Reactive (06/30 1434) NR  Flu vaccine 12/30/20 HBsAg: Negative (06/30 1434) neg  TDaP vaccine 03/03/21 Rhogam: NA HIV: Non Reactive (06/30 1434) NR  Baby Food Breast                               [id number]-- (12/27 1057)(For PCN allergy, check sensitivities)  Contraception POP, ring Pap:5/31  Circumcision yes  Hep C negative  Pediatrician [facility]   Support Person Shanon Brow                  Review of Systems: 10 point review of systems negative unless otherwise noted in HPI  Past Medical History: Patient Active Problem List   Diagnosis Date Noted   Encounter for elective induction of labor 05/04/2021   Supervision of other normal pregnancy, antepartum 09/17/2020     [facility] Prenatal Labs  Dating LMP, 12wk [place] Blood type: A/Positive/-- (06/30 1434) A+  Genetic Screen 1 Screen:    AFP:     Quad:     NIPS:INheritest neg Antibody:Negative (06/30 1434)neg  Anatomic [place] complete Rubella: 2.71 (06/30 1434)immune  GTT 126 RPR: Non Reactive (06/30 1434) NR  Flu vaccine 12/30/20 HBsAg: Negative (06/30 1434) neg  TDaP vaccine          Rhogam: NA HIV: Non Reactive (06/30 1434) NR  Baby Food                 Breast                               [id number]-- (12/27 1057)(For PCN allergy, check sensitivities)  Contraception POP, ring Pap:5/31  Circumcision yes  Hep C negative  Pediatrician [facility]   Support Person David            Past Surgical History: Past Surgical History:  Procedure Laterality Date   NO PAST SURGERIES      Past Obstetric History: # 1 - Date: 05/19/18, Sex: Female, Weight: 3930 g, GA: [redacted]w[redacted]d, Delivery: Vaginal, Spontaneous, Apgar1: 8, Apgar5: 87, Living: Living, Birth Comments: None  # 2 - Date: None, Sex: None, Weight: None, GA: None, Delivery: None, Apgar1: None, Apgar5: None, Living: None, Birth Comments: None  Family History: Family History  Problem Relation Age of Onset   Colon cancer Paternal Aunt 17   Heart disease Paternal Grandfather    Bone cancer Other    Breast cancer Other        53s   Prostate cancer Other     Social History: Social History   Socioeconomic History   Marital status: Married  Spouse name: Not on file   Number of children: Not on file   Years of education: Not on file   Highest education level: Not on file  Occupational History   Not on file  Tobacco Use   Smoking status: Never   Smokeless tobacco: Never  Vaping Use   Vaping Use: Never used  Substance and Sexual Activity   Alcohol use: Never   Drug use: Never   Sexual activity: Yes    Birth control/protection: Pill  Other Topics Concern   Not on file  Social History Narrative   Not on file   Social Determinants of Health   Financial Resource Strain: Not on file  Food Insecurity: Not on file  Transportation Needs: Not on file  Physical Activity: Not on file  Stress: Not on file  Social Connections: Not on file  Intimate Partner Violence: Not on file    Medications: Prior to Admission medications   Medication Sig Start Date End Date Taking? Authorizing Provider  Prenatal Vit-Fe Fumarate-FA (PRENATAL MULTIVITAMIN) TABS tablet Take 1  tablet by mouth daily at 12 noon.    [provider]    Allergies: Allergies  Allergen Reactions   Amoxicillin Hives    Physical Exam: Vitals: Blood pressure 133/81, pulse 91, temperature 98 F (36.7 C), temperature source Oral, resp. rate 18, height 5\' 3"  (1.6 m), weight 79 kg, last menstrual period 08/01/2020.  Urine Dip Protein: N/A  FHT: 130, moderate, +accels, no decels Toco: irregular  General: NAD HEENT: normocephalic, anicteric Pulmonary: No increased work of breathing Cardiovascular: RRR, distal pulses 2+ Abdomen: Gravid, non-tender Leopolds:vtx Genitourinary: Dilation: 1.5 Effacement (%): 70 Cervical Position: Anterior Station: -2 Exam by:: Pia Mau, RN  Extremities: no edema, erythema, or tenderness Neurologic: Grossly intact Psychiatric: mood appropriate, affect full  Labs: No results found for this or any previous visit (from the past 24 hour(s)).  Immunization History  Administered Date(s) Administered   Influenza,inj,Quad PF,6+ Mos 12/30/2017, 12/30/2020   Tdap 03/11/2018, 03/03/2021    Assessment: 25 y.o. G2P1001 [redacted]w[redacted]d by 05/08/2021, by Last Menstrual Period elective IOL  Plan: 1) Proceed with cytotec IOL  2) Fetus - - tested pelvis to 3930g   3) PNL - Blood type A/Positive/-- (06/30 1434) / Anti-bodyscreen Negative (06/30 1434) / Rubella 2.71 (06/30 1434) / Varicella Immune / RPR Non Reactive (06/30 1434) / HBsAg Negative (06/30 1434) / HIV Non Reactive (06/30 1434) / 1-hr OGTT 126 / GBS Negative/-- (12/27 1057)  4) Immunization History -  Immunization History  Administered Date(s) Administered   Influenza,inj,Quad PF,6+ Mos 12/30/2017, 12/30/2020   Tdap 03/11/2018, 03/03/2021    5) Disposition - pending delivery  Malachy Mood, MD, [place], [facility] 05/04/2021, 9:10 AM

## 2021-05-04 NOTE — Discharge Summary (Signed)
OB Discharge Summary     Patient Name: Madison Lee DOB: [birth year] MRN: [id number]  Date of admission: 05/04/2021 Delivering MD: Vena Austria   Date of discharge: 05/06/2021  Admitting diagnosis: Encounter for elective induction of labor [Z34.90] Intrauterine pregnancy: [redacted]w[redacted]d     Secondary diagnosis:  Principal Problem:   Encounter for elective induction of labor Active Problems:   SVD (spontaneous vaginal delivery)  Additional problems:none      Discharge diagnosis: Term Pregnancy Delivered                                                                                                Post partum procedures: Na  Augmentation: AROM, Pitocin, and Cytotec  Complications: None  Hospital course:  Induction of Labor With Vaginal Delivery   26 y.o. yo G2P1001 at [redacted]w[redacted]d was admitted to the hospital 05/04/2021 for induction of labor.  Indication for induction: Favorable cervix at term.  Patient had an uncomplicated labor course as follows: Membrane Rupture Time/Date: 1:57 PM ,05/04/2021   Delivery Method:Vaginal, Spontaneous  Episiotomy: None  Lacerations:  None  Details of delivery can be found in separate delivery note.  Patient had a routine postpartum course. Having some breastfeeding challenges, concerned about production, but able to latch infant independently.Patient is discharged home 05/06/21.  Newborn Data: Birth date:05/04/2021  Birth time:8:19 PM  Gender:Female  Living status:Living  Apgars:8 ,9  Weight:3370 g   Physical exam  Vitals:   05/05/21 1124 05/05/21 1525 05/05/21 2333 05/06/21 0741  BP: 111/76 113/79 106/71 105/72  Pulse: 88 78 73 69  Resp: 18 18 18 18   Temp: 97.8 F (36.6 C) 97.7 F (36.5 C) 97.6 F (36.4 C) 98.4 F (36.9 C)  TempSrc: Oral Oral  Oral  SpO2: 99% 98% 100% 100%  Weight:      Height:       General: alert and no distress Breasts: soft, nipples erect and intact bilaterally  Lochia: appropriate Uterine Fundus: firm, M  U/3 Perineum: min swelling  Extremities: no edema  DVT Evaluation: No evidence of DVT seen on physical exam. Negative Homan's sign. Labs: Lab Results  Component Value Date   WBC 10.5 05/05/2021   HGB 10.3 (L) 05/05/2021   HCT 30.3 (L) 05/05/2021   MCV 85.1 05/05/2021   PLT 194 05/05/2021   No flowsheet data found.  Discharge instruction: per After Visit Summary and "Baby and Me Booklet".  After visit meds:  Allergies as of 05/06/2021       Reactions   Amoxicillin Hives        Medication List     TAKE these medications    acetaminophen 325 MG tablet Commonly known as: Tylenol Take 2 tablets (650 mg total) by mouth every 4 (four) hours as needed (for pain scale < 4).   benzocaine-Menthol 20-0.5 % Aero Commonly known as: DERMOPLAST Apply 1 application topically as needed for irritation (perineal discomfort).   coconut oil Oil Apply 1 application topically as needed.   dibucaine 1 % Oint Commonly known as: NUPERCAINAL Place 1 application rectally as needed for hemorrhoids.   ibuprofen  600 MG tablet Commonly known as: ADVIL Take 1 tablet (600 mg total) by mouth every 6 (six) hours.   prenatal multivitamin Tabs tablet Take 1 tablet by mouth daily at 12 noon.   simethicone 80 MG chewable tablet Commonly known as: MYLICON Chew 1 tablet (80 mg total) by mouth as needed for flatulence.   witch hazel-glycerin pad Commonly known as: TUCKS Apply 1 application topically as needed for hemorrhoids.               Discharge Care Instructions  (From admission, onward)           Start     Ordered   05/06/21 0000  Discharge wound care:       Comments: SHOWER DAILY Wash incision gently with soap and water.  Call office with any drainage, redness, or firmness of the incision.   05/06/21 0926            Diet: routine diet  Activity: Advance as tolerated. Pelvic rest for 6 weeks.   Outpatient follow up:6 weeks Follow up Appt:No future  appointments. Follow up Visit:No follow-ups on file.  Postpartum contraception: Progesterone only pills then nuva ring   Newborn Data: Live born female  Birth Weight:   APGAR: 8, 9  Newborn Delivery   Birth date/time: 05/04/2021 20:19:00 Delivery type: Vaginal, Spontaneous      Baby Feeding: Breast Disposition:home with mother   05/06/2021 Madison Lee, Madison Lee

## 2021-05-04 NOTE — Progress Notes (Signed)
° °  Subjective:  Felt contractions after cytotec but feels like starting to space out, last dose 4-hrs ago  Objective:   Vitals: Blood pressure 117/62, pulse 80, temperature 98 F (36.7 C), temperature source Oral, resp. rate 18, height 5\' 3"  (1.6 m), weight 79 kg, last menstrual period 08/01/2020. General: NAD Abdomen: gravid, non-tender Cervical Exam:  Dilation: 3 Effacement (%): 70 Cervical Position: Anterior Station: -2 Exam by:: Jakeem Grape,MD  FHT: 130, moderate, +accels, no decels Toco: q2-54min  Results for orders placed or performed during the hospital encounter of 05/04/21 (from the past 24 hour(s))  CBC     Status: Abnormal   Collection Time: 05/04/21  8:16 AM  Result Value Ref Range   WBC 9.2 4.0 - 10.5 K/uL   RBC 3.86 (L) 3.87 - 5.11 MIL/uL   Hemoglobin 11.9 (L) 12.0 - 15.0 g/dL   HCT 05/06/21 (L) 46.6 - 59.9 %   MCV 84.7 80.0 - 100.0 fL   MCH 30.8 26.0 - 34.0 pg   MCHC 36.4 (H) 30.0 - 36.0 g/dL   RDW 35.7 01.7 - 79.3 %   Platelets 237 150 - 400 K/uL   nRBC 0.0 0.0 - 0.2 %  Type and screen     Status: None   Collection Time: 05/04/21  9:36 AM  Result Value Ref Range   ABO/RH(D) A POS    Antibody Screen NEG    Sample Expiration      05/07/2021,2359 Performed at [facility], [address], [place], [place]     Assessment:   26 y.o. G2P1001 [redacted]w[redacted]d IOL elective  Plan:   1) Labor - AROM clear, pitocin should contraction pattern not pick up post AROM  2) Fetus - cat I tracing  [redacted]w[redacted]d, MD, [facility] Austria [facility], [facility] 05/04/2021, 2:00 PM

## 2021-05-04 NOTE — Anesthesia Preprocedure Evaluation (Signed)
Anesthesia Evaluation  Patient identified by MRN, date of birth, ID band Patient awake    Reviewed: Allergy & Precautions, H&P , NPO status , Patient's Chart, lab work & pertinent test results, reviewed documented beta blocker date and time   Airway Mallampati: II  TM Distance: >3 FB Neck ROM: full    Dental no notable dental hx. (+) Teeth Intact   Pulmonary asthma ,    Pulmonary exam normal breath sounds clear to auscultation       Cardiovascular Exercise Tolerance: Good negative cardio ROS   Rhythm:regular Rate:Normal     Neuro/Psych negative neurological ROS  negative psych ROS   GI/Hepatic negative GI ROS, Neg liver ROS,   Endo/Other  negative endocrine ROSdiabetes, Gestational  Renal/GU      Musculoskeletal   Abdominal   Peds  Hematology negative hematology ROS (+)   Anesthesia Other Findings   Reproductive/Obstetrics (+) Pregnancy                             Anesthesia Physical Anesthesia Plan  ASA: 2  Anesthesia Plan: Epidural   Post-op Pain Management:    Induction:   PONV Risk Score and Plan:   Airway Management Planned:   Additional Equipment:   Intra-op Plan:   Post-operative Plan:   Informed Consent: I have reviewed the patients History and Physical, chart, labs and discussed the procedure including the risks, benefits and alternatives for the proposed anesthesia with the patient or authorized representative who has indicated his/her understanding and acceptance.       Plan Discussed with:   Anesthesia Plan Comments:         Anesthesia Quick Evaluation

## 2021-05-04 NOTE — Anesthesia Procedure Notes (Signed)
Epidural Patient location during procedure: OB  Staffing Performed: anesthesiologist   Preanesthetic Checklist Completed: patient identified, IV checked, site marked, risks and benefits discussed, surgical consent, monitors and equipment checked, pre-op evaluation and timeout performed  Epidural Patient position: sitting Prep: Betadine Patient monitoring: heart rate, continuous pulse ox and blood pressure Approach: midline Location: L4-L5 Injection technique: LOR air  Needle:  Needle type: Tuohy  Needle gauge: 17 G Needle length: 9 cm and 9 Needle insertion depth: 5 cm Catheter type: closed end flexible Catheter size: 19 Gauge Catheter at skin depth: 10 cm Test dose: negative and 1.5% lidocaine with Epi 1:200 K  Assessment Sensory level: T10 Events: blood not aspirated, injection not painful, no injection resistance, no paresthesia and negative IV test  Additional Notes Pt's history reviewed and consent obtained as per OB consent Patient tolerated the insertion well without complications. Negative SATD, negative IVTD All VSS were obtained and monitored through OBIX and nursing protocols followed.Reason for block:procedure for pain

## 2021-05-05 ENCOUNTER — Encounter: Payer: Self-pay | Admitting: Obstetrics and Gynecology

## 2021-05-05 LAB — CBC
HCT: 30.3 % — ABNORMAL LOW (ref 36.0–46.0)
Hemoglobin: 10.3 g/dL — ABNORMAL LOW (ref 12.0–15.0)
MCH: 28.9 pg (ref 26.0–34.0)
MCHC: 34 g/dL (ref 30.0–36.0)
MCV: 85.1 fL (ref 80.0–100.0)
Platelets: 194 10*3/uL (ref 150–400)
RBC: 3.56 MIL/uL — ABNORMAL LOW (ref 3.87–5.11)
RDW: 13.2 % (ref 11.5–15.5)
WBC: 10.5 10*3/uL (ref 4.0–10.5)
nRBC: 0 % (ref 0.0–0.2)

## 2021-05-05 LAB — RPR: RPR Ser Ql: NONREACTIVE

## 2021-05-05 MED ORDER — WITCH HAZEL-GLYCERIN EX PADS: 1.0000 "application " | MEDICATED_PAD | CUTANEOUS | Status: DC | PRN

## 2021-05-05 MED ORDER — PRENATAL MULTIVITAMIN CH
1.0000 | ORAL_TABLET | Freq: Every day | ORAL | Status: DC
  Administered 2021-05-05 – 2021-05-06 (×2): 1 via ORAL
  Filled 2021-05-05 (×2): qty 1

## 2021-05-05 MED ORDER — DIPHENHYDRAMINE HCL 25 MG PO CAPS: 25.0000 mg | ORAL_CAPSULE | Freq: Four times a day (QID) | ORAL | Status: DC | PRN

## 2021-05-05 MED ORDER — SENNOSIDES-DOCUSATE SODIUM 8.6-50 MG PO TABS
2.0000 | ORAL_TABLET | ORAL | Status: DC
  Administered 2021-05-05 – 2021-05-06 (×2): 2 via ORAL
  Filled 2021-05-05 (×2): qty 2

## 2021-05-05 MED ORDER — BENZOCAINE-MENTHOL 20-0.5 % EX AERO: 1.0000 "application " | INHALATION_SPRAY | CUTANEOUS | Status: DC | PRN

## 2021-05-05 MED ORDER — ACETAMINOPHEN 325 MG PO TABS
650.0000 mg | ORAL_TABLET | ORAL | Status: DC | PRN
  Administered 2021-05-05 (×2): 650 mg via ORAL
  Filled 2021-05-05 (×2): qty 2

## 2021-05-05 MED ORDER — ONDANSETRON HCL 4 MG/2ML IJ SOLN: 4.0000 mg | INTRAMUSCULAR | Status: DC | PRN

## 2021-05-05 MED ORDER — ONDANSETRON HCL 4 MG PO TABS: 4.0000 mg | ORAL_TABLET | ORAL | Status: DC | PRN

## 2021-05-05 MED ORDER — OXYCODONE-ACETAMINOPHEN 5-325 MG PO TABS: 2.0000 | ORAL_TABLET | ORAL | Status: DC | PRN

## 2021-05-05 MED ORDER — SIMETHICONE 80 MG PO CHEW: 80.0000 mg | CHEWABLE_TABLET | ORAL | Status: DC | PRN

## 2021-05-05 MED ORDER — COCONUT OIL OIL: 1.0000 "application " | TOPICAL_OIL | Status: DC | PRN

## 2021-05-05 MED ORDER — DIBUCAINE (PERIANAL) 1 % EX OINT: 1.0000 "application " | TOPICAL_OINTMENT | CUTANEOUS | Status: DC | PRN

## 2021-05-05 MED ORDER — OXYCODONE-ACETAMINOPHEN 5-325 MG PO TABS: 1.0000 | ORAL_TABLET | ORAL | Status: DC | PRN

## 2021-05-05 NOTE — Anesthesia Postprocedure Evaluation (Signed)
Anesthesia Post Note  Patient: Tamsen Meek  Procedure(s) Performed: AN AD HOC LABOR EPIDURAL  Anesthesia Type: Epidural Level of consciousness: awake and alert and oriented Pain management: pain level controlled Vital Signs Assessment: post-procedure vital signs reviewed and stable Respiratory status: respiratory function stable Cardiovascular status: stable Postop Assessment: no backache, patient able to bend at knees, no apparent nausea or vomiting, able to ambulate and adequate PO intake Anesthetic complications: no   No notable events documented.   Last Vitals:  Vitals:   05/05/21 0453 05/05/21 0755  BP: 108/65 108/69  Pulse: 78 72  Resp: 18 20  Temp: 37 C 36.6 C  SpO2: 100%     Last Pain:  Vitals:   05/05/21 0755  TempSrc: Oral  PainSc:                  Zachary George

## 2021-05-05 NOTE — Progress Notes (Signed)
° °  Subjective:  Doing well postpartum day 1; she is tolerating regular diet, her pain is controlled with PO medication, she is ambulating and voiding without difficulty. She reports breastfeeding is going well. She desires discharge at 24 hours pending newborn progress.   Objective:  Vital signs in last 24 hours: Temp:  [97.8 F (36.6 C)-98.8 F (37.1 C)] 97.8 F (36.6 C) (01/16 0755) Pulse Rate:  [72-99] 72 (01/16 0755) Resp:  [16-20] 20 (01/16 0755) BP: (99-135)/(54-90) 108/69 (01/16 0755) SpO2:  [97 %-100 %] 100 % (01/16 0453)    General: NAD Pulmonary: no increased work of breathing Abdomen: non-distended, non-tender, fundus firm at level of umbilicus Extremities: no edema, no erythema, no tenderness  Results for orders placed or performed during the hospital encounter of 05/04/21 (from the past 72 hour(s))  CBC     Status: Abnormal   Collection Time: 05/04/21  8:16 AM  Result Value Ref Range   WBC 9.2 4.0 - 10.5 K/uL   RBC 3.86 (L) 3.87 - 5.11 MIL/uL   Hemoglobin 11.9 (L) 12.0 - 15.0 g/dL   HCT 54.0 (L) 08.6 - 76.1 %   MCV 84.7 80.0 - 100.0 fL   MCH 30.8 26.0 - 34.0 pg   MCHC 36.4 (H) 30.0 - 36.0 g/dL   RDW 95.0 93.2 - 67.1 %   Platelets 237 150 - 400 K/uL   nRBC 0.0 0.0 - 0.2 %    Comment: Performed at [facility], [address] [place], [place] [address]  Type and screen     Status: None   Collection Time: 05/04/21  9:36 AM  Result Value Ref Range   ABO/RH(D) A POS    Antibody Screen NEG    Sample Expiration      05/07/2021,2359 Performed at [facility], [address] [place], [place] [address]   CBC     Status: Abnormal   Collection Time: 05/05/21  8:18 AM  Result Value Ref Range   WBC 10.5 4.0 - 10.5 K/uL   RBC 3.56 (L) 3.87 - 5.11 MIL/uL   Hemoglobin 10.3 (L) 12.0 - 15.0 g/dL   HCT 82.5 (L) 05.3 - 97.6 %   MCV 85.1 80.0 - 100.0 fL   MCH 28.9 26.0 - 34.0 pg   MCHC 34.0 30.0 - 36.0 g/dL   RDW 73.4 19.3 - 79.0 %   Platelets 194  150 - 400 K/uL   nRBC 0.0 0.0 - 0.2 %    Comment: Performed at [facility], [address], [place], [place] [address]    Assessment:   26 y.o. [phone] postpartum day # 1, lactating  Plan:    1) Acute blood loss anemia - hemodynamically stable and asymptomatic - po ferrous sulfate  2) Blood Type --/--/A POS (01/15 4268) / Rubella 2.71 (06/30 1434) / Varicella Immune  3) TDAP status  given antepartum  4) Feeding plan  breastfeeding  5)  Education given regarding options for contraception, as well as compatibility with breast feeding if applicable.  Patient plans on oral progesterone-only contraceptive for contraception with transition to ring.  6) Disposition: continue with current care, possible discharge later today   [name], CNM [facility] 05/05/2021, 11:13 AM

## 2021-05-06 MED ORDER — SIMETHICONE 80 MG PO CHEW: 80.0000 mg | CHEWABLE_TABLET | ORAL | 0 refills | Status: DC | PRN

## 2021-05-06 MED ORDER — WITCH HAZEL-GLYCERIN EX PADS: 1.0000 "application " | MEDICATED_PAD | CUTANEOUS | 12 refills | Status: DC | PRN

## 2021-05-06 MED ORDER — COCONUT OIL OIL: 1.0000 "application " | TOPICAL_OIL | 0 refills | Status: DC | PRN

## 2021-05-06 MED ORDER — IBUPROFEN 600 MG PO TABS: 600.0000 mg | ORAL_TABLET | Freq: Four times a day (QID) | ORAL | 0 refills | Status: DC

## 2021-05-06 MED ORDER — BENZOCAINE-MENTHOL 20-0.5 % EX AERO: 1.0000 "application " | INHALATION_SPRAY | CUTANEOUS | Status: DC | PRN

## 2021-05-06 MED ORDER — DIBUCAINE (PERIANAL) 1 % EX OINT: 1.0000 "application " | TOPICAL_OINTMENT | CUTANEOUS | Status: DC | PRN

## 2021-05-06 MED ORDER — ACETAMINOPHEN 325 MG PO TABS: 650.0000 mg | ORAL_TABLET | ORAL | Status: DC | PRN

## 2021-05-06 NOTE — Progress Notes (Signed)
AVS and discharge teaching were reviewed patient verbalizes understanding. States she will make a post partum appointment for 6 weeks.

## 2021-06-16 ENCOUNTER — Other Ambulatory Visit: Payer: Self-pay

## 2021-06-16 ENCOUNTER — Encounter: Payer: Self-pay | Admitting: Obstetrics and Gynecology

## 2021-06-16 ENCOUNTER — Ambulatory Visit (INDEPENDENT_AMBULATORY_CARE_PROVIDER_SITE_OTHER): Payer: Medicaid Other | Admitting: Obstetrics and Gynecology

## 2021-06-16 DIAGNOSIS — Z30018 Encounter for initial prescription of other contraceptives: Secondary | ICD-10-CM

## 2021-06-16 MED ORDER — ETONOGESTREL-ETHINYL ESTRADIOL 0.12-0.015 MG/24HR VA RING: VAGINAL_RING | VAGINAL | 12 refills | Status: DC

## 2021-06-16 NOTE — Progress Notes (Signed)
Postpartum Visit  Chief Complaint:  Chief Complaint  Patient presents with   Postpartum Care    History of Present Illness: Patient is a 26 y.o. [id number] presents for postpartum visit.  Date of delivery: 05/04/2021 Type of delivery: Vaginal Laceration: None   Breast Feeding:  yes Lochia: normal  Edinburgh Post-Partum Depression Score: 4  Date of last PAP: 07/09/2021  normal   She reports no concerns. She and her infant have been doing well at home.   Newborn Details:  SINGLETON :  1. Infant Status: Infant doing well at home with mother.   Review of Systems: Review of Systems  Constitutional:  Negative for chills, fever, malaise/fatigue and weight loss.  HENT:  Negative for congestion, hearing loss and sinus pain.   Eyes:  Negative for blurred vision and double vision.  Respiratory:  Negative for cough, sputum production, shortness of breath and wheezing.   Cardiovascular:  Negative for chest pain, palpitations, orthopnea and leg swelling.  Gastrointestinal:  Negative for abdominal pain, constipation, diarrhea, nausea and vomiting.  Genitourinary:  Negative for dysuria, flank pain, frequency, hematuria and urgency.  Musculoskeletal:  Negative for back pain, falls and joint pain.  Skin:  Negative for itching and rash.  Neurological:  Negative for dizziness and headaches.  Psychiatric/Behavioral:  Negative for depression, substance abuse and suicidal ideas. The patient is not nervous/anxious.    Past Medical History:  Past Medical History:  Diagnosis Date   Asthma     Past Surgical History:  Past Surgical History:  Procedure Laterality Date   NO PAST SURGERIES      Family History:  Family History  Problem Relation Age of Onset   Colon cancer Paternal Aunt 55   Heart disease Paternal Grandfather    Bone cancer Other    Breast cancer Other        58s   Prostate cancer Other     Social History:  Social History   Socioeconomic History   Marital status:  Married    Spouse name: Not on file   Number of children: Not on file   Years of education: Not on file   Highest education level: Not on file  Occupational History   Not on file  Tobacco Use   Smoking status: Never   Smokeless tobacco: Never  Vaping Use   Vaping Use: Never used  Substance and Sexual Activity   Alcohol use: Never   Drug use: Never   Sexual activity: Yes    Birth control/protection: Pill  Other Topics Concern   Not on file  Social History Narrative   Not on file   Social Determinants of Health   Financial Resource Strain: Not on file  Food Insecurity: Not on file  Transportation Needs: Not on file  Physical Activity: Not on file  Stress: Not on file  Social Connections: Not on file  Intimate Partner Violence: Not on file    Allergies:  Allergies  Allergen Reactions   Amoxicillin Hives    Medications: Prior to Admission medications   Medication Sig Start Date End Date Taking? Authorizing Provider  acetaminophen (TYLENOL) 325 MG tablet Take 2 tablets (650 mg total) by mouth every 4 (four) hours as needed (for pain scale < 4). 05/06/21  Yes Dominic, Nunzio Cobbs, CNM  ibuprofen (ADVIL) 600 MG tablet Take 1 tablet (600 mg total) by mouth every 6 (six) hours. 05/06/21  Yes Dominic, Nunzio Cobbs, CNM  benzocaine-Menthol (DERMOPLAST) 20-0.5 % AERO Apply 1 application topically as  needed for irritation (perineal discomfort). Patient not taking: Reported on 06/16/2021 05/06/21   Allen Derry, CNM  coconut oil OIL Apply 1 application topically as needed. Patient not taking: Reported on 06/16/2021 05/06/21   DominicNunzio Cobbs, CNM  dibucaine (NUPERCAINAL) 1 % OINT Place 1 application rectally as needed for hemorrhoids. Patient not taking: Reported on 06/16/2021 05/06/21   DominicNunzio Cobbs, CNM  Prenatal Vit-Fe Fumarate-FA (PRENATAL MULTIVITAMIN) TABS tablet Take 1 tablet by mouth daily at 12 noon. Patient not taking: Reported on 06/16/2021    [provider]  simethicone (MYLICON) 80 MG chewable tablet Chew 1 tablet (80 mg total) by mouth as needed for flatulence. Patient not taking: Reported on 06/16/2021 05/06/21   DominicNunzio Cobbs, CNM  witch hazel-glycerin (TUCKS) pad Apply 1 application topically as needed for hemorrhoids. Patient not taking: Reported on 06/16/2021 05/06/21   Allen Derry, CNM    Physical Exam Vitals:  Vitals:   06/16/21 0923  BP: 116/70    Physical Exam Constitutional:      Appearance: Normal appearance. She is well-developed.  HENT:     Head: Normocephalic and atraumatic.  Eyes:     Extraocular Movements: Extraocular movements intact.     Pupils: Pupils are equal, round, and reactive to light.  Neck:     Thyroid: No thyromegaly.  Cardiovascular:     Rate and Rhythm: Normal rate and regular rhythm.     Heart sounds: Normal heart sounds.  Pulmonary:     Effort: Pulmonary effort is normal.     Breath sounds: Normal breath sounds.  Abdominal:     General: Bowel sounds are normal. There is no distension.     Palpations: Abdomen is soft. There is no mass.  Musculoskeletal:     Cervical back: Neck supple.  Neurological:     Mental Status: She is alert and oriented to person, place, and time.  Skin:    General: Skin is warm and dry.  Psychiatric:        Behavior: Behavior normal.        Thought Content: Thought content normal.        Judgment: Judgment normal.  Vitals reviewed.    Assessment: 26 y.o. [id number] presenting for 6 week postpartum visit  Plan: Problem List Items Addressed This Visit   None Visit Diagnoses     Postpartum care and examination    -  Primary   Encounter for prescription for nuvaring       Relevant Medications   etonogestrel-ethinyl estradiol (NUVARING) 0.12-0.015 MG/24HR vaginal ring       1) Contraception-  desires to start Nuvaring  2)  Pap: up to date  3) Patient underwent screening for postpartum depression with no concerns noted.  -  Follow up in 1 year   Adrian Prows MD, [place], [facility] 06/16/2021 9:46 AM

## 2021-07-22 ENCOUNTER — Encounter: Payer: Self-pay | Admitting: Physician Assistant

## 2021-07-22 ENCOUNTER — Ambulatory Visit (INDEPENDENT_AMBULATORY_CARE_PROVIDER_SITE_OTHER): Payer: Medicaid Other | Admitting: Physician Assistant

## 2021-07-22 VITALS — BP 100/65 | HR 68 | Temp 98.0°F | Ht 63.0 in | Wt 152.0 lb

## 2021-07-22 DIAGNOSIS — Z7689 Persons encountering health services in other specified circumstances: Secondary | ICD-10-CM

## 2021-07-22 DIAGNOSIS — Z0000 Encounter for general adult medical examination without abnormal findings: Secondary | ICD-10-CM

## 2021-07-22 DIAGNOSIS — Z8709 Personal history of other diseases of the respiratory system: Secondary | ICD-10-CM | POA: Diagnosis not present

## 2021-07-22 DIAGNOSIS — Z8349 Family history of other endocrine, nutritional and metabolic diseases: Secondary | ICD-10-CM | POA: Diagnosis not present

## 2021-07-22 NOTE — Progress Notes (Signed)
? ?New Patient Office Visit ? ?Subjective:  ?Patient ID: Madison Lee, female    DOB: January 28, 1996  Age: 26 y.o. MRN: [id number] ? ?CC:  ?Chief Complaint  ?Patient presents with  ? New Patient (Initial Visit)  ? ? ?HPI ?Madison Lee presents to establish care. Patient has no acute concerns. Has past medical hx of asthma. States relocated to the area about 1-1.5 yr ago. Pt is established with [facility]. Currently on Nuvaring. Does not take medications or supplements. Family history is pertinent for hyperthyroidism, skin cancer, colon cancer and breast cancer. Patient has never been a smoker, does not drink alcohol and limits to 1 cup of caffeine per day. Enrolled in a gym program 3 days a week, sessions last 1 hr. No asthma exacerbations, cough or wheezing.  ? ?Past Medical History:  ?Diagnosis Date  ? Asthma   ? ? ?Past Surgical History:  ?Procedure Laterality Date  ? NO PAST SURGERIES    ? ? ?Family History  ?Problem Relation Age of Onset  ? Hyperthyroidism Mother   ? Cancer Father   ?     Skin  ? Colon cancer Paternal Aunt 64  ? Heart disease Paternal Grandfather   ? Bone cancer Other   ? Breast cancer Other   ?     41s  ? Prostate cancer Other   ? ? ?Social History  ? ?Socioeconomic History  ? Marital status: Married  ?  Spouse name: Madison Lee  ? Number of children: 2  ? Years of education: Not on file  ? Highest education level: Not on file  ?Occupational History  ? Not on file  ?Tobacco Use  ? Smoking status: Never  ? Smokeless tobacco: Never  ?Vaping Use  ? Vaping Use: Never used  ?Substance and Sexual Activity  ? Alcohol use: Never  ? Drug use: Never  ? Sexual activity: Yes  ?  Birth control/protection: Pill  ?Other Topics Concern  ? Not on file  ?Social History Narrative  ? Not on file  ? ?Social Determinants of Health  ? ?Financial Resource Strain: Not on file  ?Food Insecurity: Not on file  ?Transportation Needs: Not on file  ?Physical Activity: Not on file  ?Stress: Not on file   ?Social Connections: Not on file  ?Intimate Partner Violence: Not on file  ? ? ?ROS ?Review of Systems ?Review of Systems:  ?A fourteen system review of systems was performed and found to be positive as per HPI. ? ? ?Objective:  ? ?Today's Vitals: BP 100/65   Pulse 68   Temp 98 ?F (36.7 ?C)   Ht 5\' 3"  (1.6 m)   Wt 152 lb (68.9 kg)   LMP 07/14/2021   SpO2 98%   Breastfeeding Yes   BMI 26.93 kg/m?  ? ?Physical Exam ?General:  Pleasant and cooperative, appropriate for stated age.  ?Neuro:  Alert and oriented,  extra-ocular muscles intact  ?HEENT:  Normocephalic, atraumatic, neck supple  ?Skin:  no gross rash, warm, pink. ?Cardiac:  RRR, S1 S2 ?Respiratory: CTA B/L  ?Vascular:  Ext warm, no cyanosis apprec.; cap RF less 2 sec. ?Psych:  No HI/SI, judgement and insight good, Euthymic mood. Full Affect. ? ?Assessment & Plan:  ? ?Problem List Items Addressed This Visit   ?None ?Visit Diagnoses   ? ? Family history of hyperthyroidism    -  Primary  ? Healthcare maintenance      ? Encounter to establish care      ?  History of asthma      ? ?  ? ?Encounter to establish care: ?-Reviewed OB/GYN records in Epic. Last visit 06/16/2021 for 6 week postpartum care. On Nuvaring.  UTD with pap smear. ?-Recommend to schedule CPE and FBW.  ?-Recommend to monitor for re-occurrence of asthma, asx and pulm exam normal. ? ? ? ?Outpatient Encounter Medications as of 07/22/2021  ?Medication Sig  ? acetaminophen (TYLENOL) 325 MG tablet Take 2 tablets (650 mg total) by mouth every 4 (four) hours as needed (for pain scale < 4).  ? etonogestrel-ethinyl estradiol (NUVARING) 0.12-0.015 MG/24HR vaginal ring Insert vaginally and leave in place for 3 consecutive weeks, then remove for 1 week.  ? ibuprofen (ADVIL) 600 MG tablet Take 1 tablet (600 mg total) by mouth every 6 (six) hours.  ? [DISCONTINUED] benzocaine-Menthol (DERMOPLAST) 20-0.5 % AERO Apply 1 application topically as needed for irritation (perineal discomfort). (Patient not taking:  Reported on 06/16/2021)  ? [DISCONTINUED] coconut oil OIL Apply 1 application topically as needed. (Patient not taking: Reported on 06/16/2021)  ? [DISCONTINUED] dibucaine (NUPERCAINAL) 1 % OINT Place 1 application rectally as needed for hemorrhoids. (Patient not taking: Reported on 06/16/2021)  ? [DISCONTINUED] Prenatal Vit-Fe Fumarate-FA (PRENATAL MULTIVITAMIN) TABS tablet Take 1 tablet by mouth daily at 12 noon. (Patient not taking: Reported on 06/16/2021)  ? [DISCONTINUED] simethicone (MYLICON) 80 MG chewable tablet Chew 1 tablet (80 mg total) by mouth as needed for flatulence. (Patient not taking: Reported on 06/16/2021)  ? [DISCONTINUED] witch hazel-glycerin (TUCKS) pad Apply 1 application topically as needed for hemorrhoids. (Patient not taking: Reported on 06/16/2021)  ? ?No facility-administered encounter medications on file as of 07/22/2021.  ? ? ?Follow-up: Return for CPE and FBW in 4-6 months .  ? ?Mayer Masker, PA-C ? ?

## 2021-08-14 ENCOUNTER — Ambulatory Visit: Payer: Medicaid Other | Admitting: Physician Assistant

## 2021-08-20 ENCOUNTER — Ambulatory Visit (INDEPENDENT_AMBULATORY_CARE_PROVIDER_SITE_OTHER): Payer: Medicaid Other | Admitting: Physician Assistant

## 2021-08-20 ENCOUNTER — Encounter: Payer: Self-pay | Admitting: Physician Assistant

## 2021-08-20 VITALS — BP 99/67 | HR 94 | Temp 97.7°F | Ht 64.0 in | Wt 152.0 lb

## 2021-08-20 DIAGNOSIS — J0140 Acute pansinusitis, unspecified: Secondary | ICD-10-CM

## 2021-08-20 MED ORDER — AZITHROMYCIN 250 MG PO TABS: ORAL_TABLET | ORAL | 0 refills | Status: AC

## 2021-08-20 NOTE — Patient Instructions (Signed)
Sinus Pain  Sinus pain may occur when your sinuses become clogged or swollen. Sinuses are air-filled spaces in your skull that are behind the bones of your face and forehead. Sinus pain can range from mild to severe. What are the causes? Sinus pain can result from various conditions that affect the sinuses. Common causes include: Colds. Sinus infections. Allergies. What are the signs or symptoms? The main symptom of this condition is pain or pressure in your face, forehead, ears, or upper teeth. People who have sinus pain often have other symptoms, such as: Congested or runny nose. Fever. Inability to smell. Headache. Weather changes can make symptoms worse. How is this diagnosed? Your health care provider will diagnose this condition based on your symptoms and a physical exam. If you have pain that keeps coming back or does not go away, your health care provider may recommend more testing. This may include: Imaging tests, such as a CT scan or MRI, to check for problems with your sinuses. Examination of your sinuses using a thin tool with a camera that is inserted through your nose (endoscopy). How is this treated? Treatment for this condition depends on the cause. Sinus pain that is caused by a sinus infection may be treated with antibiotic medicine. Sinus pain that is caused by congestion may be helped by rinsing out (flushing) the nose and sinuses with saline solution. Sinus pain that is caused by allergies may be helped by allergy medicines (antihistamines) and medicated nasal sprays. Sinus surgery may be needed in some cases if other treatments do not help. Follow these instructions at home: General instructions If directed: Apply a warm, moist washcloth to your face to help relieve pain. Use a nasal saline wash. Follow the directions on the bottle or box. Hydrate and humidify Drink enough water to keep your urine clear or pale yellow. Staying hydrated will help to thin your  mucus. Use a humidifier if your home is dry. Inhale steam for 10-15 minutes, 3-4 times a day or as told by your health care provider. You can do this in the bathroom while a hot shower is running. Limit your exposure to cool or dry air. Medicines  Take over-the-counter and prescription medicines only as told by your health care provider. If you were prescribed an antibiotic medicine, take it as told by your health care provider. Do not stop taking the antibiotic even if you start to feel better. If you have congestion, use a nasal spray to help lessen pressure. Contact a health care provider if: You have sinus pain more than one time a week. You have sensitivity to light or sound. You develop a fever. You feel nauseous or you vomit. Your sinus pain or headache does not get better with treatment. Get help right away if: You have vision problems. You have sudden, severe pain in your face or head. You have a seizure. You are confused. You have a stiff neck. Summary Sinus pain occurs when your sinuses become clogged or swollen. Sinus pain can result from various conditions that affect the sinuses, such as a cold, a sinus infection, or an allergy. Treatment for this condition depends on the cause. It may include medicine, such as antibiotics or antihistamines. This information is not intended to replace advice given to you by your health care provider. Make sure you discuss any questions you have with your health care provider. Document Revised: 03/09/2021 Document Reviewed: 03/09/2021 Elsevier Patient Education  2023 Elsevier Inc.  

## 2021-08-20 NOTE — Progress Notes (Signed)
?  Established patient acute visit ? ? ?Patient: Madison Lee   DOB: [birth year]   25 y.o. Female  MRN: [id number] ?Visit Date: 08/20/2021 ? ?Chief Complaint  ?Patient presents with  ? Nasal Congestion  ?   ?  ? Cough  ? ?Subjective  ?  ?HPI ?HPI   ? ? Nasal Congestion   ? Additional comments:  ? ? ?  ?  ?Last edited by Sylvester Harder, CMA on 08/20/2021 10:32 AM.  ?  ?  ?Patient presents with c/o nasal congestion, sinus pressure, headache, sunny nose, cough and postnasal drip x 2 weeks. Cancelled her appointment last week because she was feeling better but Monday of this week started to feel worse. No fever, chills, or night sweats. Symptoms are worse at night time and due to mucus feels some shortness of breath.  ? ? ? ?Medications: ?Outpatient Medications Prior to Visit  ?Medication Sig  ? acetaminophen (TYLENOL) 325 MG tablet Take 2 tablets (650 mg total) by mouth every 4 (four) hours as needed (for pain scale < 4).  ? etonogestrel-ethinyl estradiol (NUVARING) 0.12-0.015 MG/24HR vaginal ring Insert vaginally and leave in place for 3 consecutive weeks, then remove for 1 week.  ? ibuprofen (ADVIL) 600 MG tablet Take 1 tablet (600 mg total) by mouth every 6 (six) hours.  ? ?No facility-administered medications prior to visit.  ? ? ?Review of Systems ?Review of Systems:  ?A fourteen system review of systems was performed and found to be positive as per HPI. ? ? ? ?  Objective  ?  ?BP 99/67   Pulse 94   Temp 97.7 ?F (36.5 ?C)   Ht 5\' 4"  (1.626 m)   Wt 152 lb (68.9 kg)   SpO2 100%   Breastfeeding Yes   BMI 26.09 kg/m?  ? ? ?Physical Exam  ?General:  Well Developed, well nourished, appropriate for stated age.  ?Neuro:  Alert and oriented,  extra-ocular muscles intact  ?HEENT:  Normocephalic, atraumatic, sinus tenderness of maxillary and sphenoid, opaque TM's of both ears, boggy turbinates, erythema of posterior oropharynx, neck supple, +anterior cervical adenopathy   ?Skin:  no gross rash, warm,  pink. ?Cardiac:  RRR, S1 S2 ?Respiratory: CTA B/L w/o wheezing, crackles or rales.  ?Vascular:  Ext warm, no cyanosis apprec.; cap RF less 2 sec. ?Psych:  No HI/SI, judgement and insight good, Euthymic mood. Full Affect. ? ? ?No results found for any visits on 08/20/21. ? Assessment & Plan  ?  ? ?Patient has s/sx consistent with bacterial sinusitis. Will start antibiotic therapy with Azithromycin. Recommend to continue decongestant for severe symptoms. Also recommend use of humidifier and nasal rinses. Follow-up prn. ? ? ?Return if symptoms worsen or fail to improve.  ?   ? ? ? ?10/20/21, PA-C  ?[facility] ?[phone] (phone) ?[phone] (fax) ? ? [facility] ?

## 2021-08-22 ENCOUNTER — Telehealth: Payer: Self-pay | Admitting: Physician Assistant

## 2021-08-22 DIAGNOSIS — J0140 Acute pansinusitis, unspecified: Secondary | ICD-10-CM

## 2021-08-22 MED ORDER — PREDNISONE 20 MG PO TABS: 20.0000 mg | ORAL_TABLET | Freq: Every day | ORAL | 0 refills | Status: DC

## 2021-08-22 NOTE — Telephone Encounter (Signed)
Patient has been on antibiotics for 2 1/2 days and was instructed to call the office if no improvement and she feels she is getting worse. Please advise ?

## 2021-08-22 NOTE — Telephone Encounter (Signed)
Per patient she is having pain/fullness on the L side of her face (ear and throat) Per Kandis Cocking, advised patient sending in steroid (low dose) to pharmacy. Take 1 daily with antibiotic. Patient verbalized understanding and was agreeable. AS, CMA ?

## 2021-10-02 ENCOUNTER — Ambulatory Visit (INDEPENDENT_AMBULATORY_CARE_PROVIDER_SITE_OTHER): Payer: Medicaid Other | Admitting: Nurse Practitioner

## 2021-10-02 ENCOUNTER — Encounter: Payer: Self-pay | Admitting: Nurse Practitioner

## 2021-10-02 VITALS — BP 116/74 | HR 81 | Temp 97.3°F | Ht 64.0 in | Wt 150.3 lb

## 2021-10-02 DIAGNOSIS — J0140 Acute pansinusitis, unspecified: Secondary | ICD-10-CM

## 2021-10-02 MED ORDER — AZITHROMYCIN 250 MG PO TABS: ORAL_TABLET | ORAL | 0 refills | Status: DC

## 2021-10-02 NOTE — Progress Notes (Unsigned)
Established patient visit   Patient: Madison Lee   DOB: Feb 23, 1996   25 y.o. Female  MRN: [id number] Visit Date: 10/02/2021  No chief complaint on file.  Subjective    Sinusitis This is a new problem. The current episode started in the past 7 days. The problem is unchanged. There has been no fever. Associated symptoms include congestion, ear pain, headaches, sinus pressure, sneezing, a sore throat and swollen glands. Treatments tried: NSAIDs - has helped with headache without relief of congestion. The treatment provided mild relief.    ***  Medications: Outpatient Medications Prior to Visit  Medication Sig   acetaminophen (TYLENOL) 325 MG tablet Take 2 tablets (650 mg total) by mouth every 4 (four) hours as needed (for pain scale < 4).   etonogestrel-ethinyl estradiol (NUVARING) 0.12-0.015 MG/24HR vaginal ring Insert vaginally and leave in place for 3 consecutive weeks, then remove for 1 week.   ibuprofen (ADVIL) 600 MG tablet Take 1 tablet (600 mg total) by mouth every 6 (six) hours.   predniSONE (DELTASONE) 20 MG tablet Take 1 tablet (20 mg total) by mouth daily with breakfast.   No facility-administered medications prior to visit.    Review of Systems  HENT:  Positive for congestion, ear pain, sinus pressure, sneezing and sore throat.   Neurological:  Positive for headaches.     Objective    There were no vitals filed for this visit. There is no height or weight on file to calculate BMI.   Physical Exam  ***  No results found for any visits on 10/02/21.  Assessment & Plan     ***  No follow-ups on file.        Carlean Jews, NP  [facility] [phone] (phone) [phone] (fax)  [facility]

## 2021-10-12 DIAGNOSIS — J0140 Acute pansinusitis, unspecified: Secondary | ICD-10-CM | POA: Insufficient documentation

## 2021-10-12 HISTORY — DX: Acute pansinusitis, unspecified: J01.40

## 2021-11-26 NOTE — Progress Notes (Deleted)
Complete physical exam   Patient: Madison Lee   DOB: April 08, 1996   26 y.o. Female  MRN: [id number] Visit Date: 11/27/2021   No chief complaint on file.  Subjective    Madison Lee is a 26 y.o. female who presents today for a complete physical exam.  She reports consuming a {diet types:17450} diet. {Exercise:19826} She generally feels {well/fairly well/poorly:18703}. She {does/does not:200015} have additional problems to discuss today.   ***  Past Medical History:  Diagnosis Date   Asthma    Past Surgical History:  Procedure Laterality Date   NO PAST SURGERIES     Social History   Socioeconomic History   Marital status: Married    Spouse name: Ilze Roselli   Number of children: 2   Years of education: Not on file   Highest education level: Not on file  Occupational History   Not on file  Tobacco Use   Smoking status: Never   Smokeless tobacco: Never  Vaping Use   Vaping Use: Never used  Substance and Sexual Activity   Alcohol use: Never   Drug use: Never   Sexual activity: Yes    Birth control/protection: Pill  Other Topics Concern   Not on file  Social History Narrative   Not on file   Social Determinants of Health   Financial Resource Strain: Not on file  Food Insecurity: Not on file  Transportation Needs: Not on file  Physical Activity: Not on file  Stress: Not on file  Social Connections: Not on file  Intimate Partner Violence: Not on file     Medications: Outpatient Medications Prior to Visit  Medication Sig   acetaminophen (TYLENOL) 325 MG tablet Take 2 tablets (650 mg total) by mouth every 4 (four) hours as needed (for pain scale < 4).   azithromycin (ZITHROMAX) 250 MG tablet z-pack - take as directed for 5 days   etonogestrel-ethinyl estradiol (NUVARING) 0.12-0.015 MG/24HR vaginal ring Insert vaginally and leave in place for 3 consecutive weeks, then remove for 1 week.   ibuprofen (ADVIL) 600 MG tablet Take 1 tablet (600 mg total)  by mouth every 6 (six) hours.   predniSONE (DELTASONE) 20 MG tablet Take 1 tablet (20 mg total) by mouth daily with breakfast.   No facility-administered medications prior to visit.    Review of Systems Review of Systems:  A fourteen system review of systems was performed and found to be positive as per HPI.  Last CBC Lab Results  Component Value Date   WBC 10.5 05/05/2021   HGB 10.3 (L) 05/05/2021   HCT 30.3 (L) 05/05/2021   MCV 85.1 05/05/2021   MCH 28.9 05/05/2021   RDW 13.2 05/05/2021   PLT 194 76/16/0737   Last metabolic panel No results found for: "GLUCOSE", "NA", "K", "CL", "CO2", "BUN", "CREATININE", "EGFR", "CALCIUM", "PHOS", "PROT", "ALBUMIN", "LABGLOB", "AGRATIO", "BILITOT", "ALKPHOS", "AST", "ALT", "ANIONGAP" Last lipids No results found for: "CHOL", "HDL", "LDLCALC", "LDLDIRECT", "TRIG", "CHOLHDL" Last hemoglobin A1c No results found for: "HGBA1C" Last thyroid functions No results found for: "TSH", "T3TOTAL", "T4TOTAL", "THYROIDAB" Last vitamin D No results found for: "25OHVITD2", "25OHVITD3", "VD25OH"  Objective    There were no vitals taken for this visit. BP Readings from Last 3 Encounters:  10/02/21 116/74  08/20/21 99/67  07/22/21 100/65   Wt Readings from Last 3 Encounters:  10/02/21 150 lb 4.8 oz (68.2 kg)  08/20/21 152 lb (68.9 kg)  07/22/21 152 lb (68.9 kg)      Physical Exam  General Appearance:     {[phone]::"Overweight"} female. Alert, cooperative, in no acute distress, appears stated age   Head:    Normocephalic, without obvious abnormality, atraumatic  Eyes:    PERRL, conjunctiva/corneas clear, EOM's intact, fundi    benign, both eyes  Ears:    Normal TM's and external ear canals, both ears  Nose:   Nares normal, septum midline, mucosa normal, no drainage    or sinus tenderness  Throat:   Lips, mucosa, and tongue normal; teeth and gums normal  Neck:   Supple, symmetrical, trachea midline, no adenopathy;    thyroid:   no enlargement/tenderness/nodules; no carotid   bruit or JVD  Back:     Symmetric, no curvature, ROM normal, no CVA tenderness  Lungs:     Clear to auscultation bilaterally, respirations unlabored  Chest Wall:    No tenderness or deformity   Heart:    Normal heart rate. Normal rhythm. No murmurs, rubs, or gallops.   Breast Exam:    {Exam; breast:13139::"deferred"}  Abdomen:     Soft, non-tender, bowel sounds active all four quadrants,    no masses, no organomegaly  Pelvic:    {pelvic exam:16852::"deferred"}  Extremities:   All extremities are intact. No cyanosis or edema  Pulses:   2+ and symmetric all extremities  Skin:   Skin color, texture, turgor normal, no rashes or lesions  Lymph nodes:   Cervical, supraclavicular, and axillary nodes normal  Neurologic:   CNII-XII intact, normal strength, sensation and reflexes    throughout     Last depression screening scores    10/02/2021   11:35 AM 08/20/2021   10:36 AM 07/22/2021    9:26 AM  PHQ 2/9 Scores  PHQ - 2 Score 0 0 0  PHQ- 9 Score 0  0   Last fall risk screening    08/20/2021   10:36 AM  Fall Risk   Falls in the past year? 0  Number falls in past yr: 0  Injury with Fall? 0  Risk for fall due to : No Fall Risks  Follow up Falls evaluation completed     No results found for any visits on 11/27/21.  Assessment & Plan    Routine Health Maintenance and Physical Exam  Exercise Activities and Dietary recommendations -Discussed heart healthy diet low in fat and carbohydrates. Recommend moderate exercise 150 mins/wk.  Immunization History  Administered Date(s) Administered   Influenza,inj,Quad PF,6+ Mos 12/30/2017, 12/30/2020   Tdap 03/11/2018, 03/03/2021    Health Maintenance  Topic Date Due   HPV VACCINES (1 - 2-dose series) Never done   INFLUENZA VACCINE  11/18/2021   COVID-19 Vaccine (1) 04/19/2022 (Originally 04/21/1996)   PAP-Cervical Cytology Screening  09/18/2023   PAP SMEAR-Modifier  09/18/2023   TETANUS/TDAP   03/04/2031   Hepatitis C Screening  Completed   HIV Screening  Completed    Discussed health benefits of physical activity, and encouraged her to engage in regular exercise appropriate for her age and condition.  Problem List Items Addressed This Visit   None  Fasting labs UTD pap and Tdap.  Completed Hep and HIV screenings.   No follow-ups on file.       Lorrene Reid, PA-C  [facility] at [facility] [phone] (phone) [phone] (fax)  [employer]

## 2021-11-27 ENCOUNTER — Encounter: Payer: Medicaid Other | Admitting: Physician Assistant

## 2021-11-27 DIAGNOSIS — Z0000 Encounter for general adult medical examination without abnormal findings: Secondary | ICD-10-CM

## 2022-02-23 DIAGNOSIS — R0989 Other specified symptoms and signs involving the circulatory and respiratory systems: Secondary | ICD-10-CM | POA: Diagnosis not present

## 2022-02-23 DIAGNOSIS — J029 Acute pharyngitis, unspecified: Secondary | ICD-10-CM | POA: Diagnosis not present

## 2022-02-23 DIAGNOSIS — J069 Acute upper respiratory infection, unspecified: Secondary | ICD-10-CM | POA: Diagnosis not present

## 2022-06-24 ENCOUNTER — Ambulatory Visit (INDEPENDENT_AMBULATORY_CARE_PROVIDER_SITE_OTHER): Payer: Medicaid Other | Admitting: Licensed Practical Nurse

## 2022-06-24 ENCOUNTER — Other Ambulatory Visit (HOSPITAL_COMMUNITY)
Admission: RE | Admit: 2022-06-24 | Discharge: 2022-06-24 | Disposition: A | Discharge status: Home / Self Care | Payer: Medicaid Other | Source: Ambulatory Visit | Attending: Licensed Practical Nurse | Admitting: Licensed Practical Nurse

## 2022-06-24 VITALS — BP 121/82 | HR 75 | Ht 64.0 in | Wt 151.7 lb

## 2022-06-24 DIAGNOSIS — N923 Ovulation bleeding: Secondary | ICD-10-CM

## 2022-06-24 DIAGNOSIS — Z30018 Encounter for initial prescription of other contraceptives: Secondary | ICD-10-CM

## 2022-06-24 DIAGNOSIS — Z124 Encounter for screening for malignant neoplasm of cervix: Secondary | ICD-10-CM | POA: Diagnosis present

## 2022-06-24 DIAGNOSIS — Z01419 Encounter for gynecological examination (general) (routine) without abnormal findings: Secondary | ICD-10-CM | POA: Insufficient documentation

## 2022-06-24 MED ORDER — ETONOGESTREL-ETHINYL ESTRADIOL 0.12-0.015 MG/24HR VA RING: VAGINAL_RING | VAGINAL | 12 refills | Status: DC

## 2022-06-24 NOTE — Progress Notes (Signed)
Gynecology Annual Exam  PCP: Lorrene Reid, PA-C (Inactive)  Chief Complaint:  Chief Complaint  Patient presents with   Gynecologic Exam    History of Present Illness: Patient is a 27 y.o. [id number] presents for annual exam. The patient has no complaints today.   LMP: Patient's last menstrual period was 06/04/2022 (exact date). Menarche:{numbers [id number] Average Interval: {Desc; regular/irreg:14544}, {numbers 22-35:14824} days Duration of flow: {numbers; 0-10:33138} days Heavy Menses: {yes/no:63} Clots: {yes/no:63} Intermenstrual Bleeding: {yes/no:63} Postcoital Bleeding: {yes/no:63} Dysmenorrhea: {yes/no:63}  The patient {sys sexually active:13135} sexually active. She currently uses {method:5051} for contraception. She {has/denies:315300} dyspareunia.  The patient {DOES_DOES NF:[id number] perform self breast exams.  There {is/is no:19420} notable family history of breast or ovarian cancer in her family.  The patient wears seatbelts: {yes/no:63}.  The patient has regular exercise: {yes/no/not asked:9010}.    The patient {Blank single:19197::"repots","denies"} current symptoms of depression.    Review of Systems: ROS  Past Medical History:  Patient Active Problem List   Diagnosis Date Noted   Acute non-recurrent pansinusitis 10/12/2021   Encounter for elective induction of labor 05/04/2021   SVD (spontaneous vaginal delivery)     Past Surgical History:  Past Surgical History:  Procedure Laterality Date   NO PAST SURGERIES      Gynecologic History:  Patient's last menstrual period was 06/04/2022 (exact date). Contraception: {method:5051} Last Pap: Results were: *** {Findings; lab pap smear results:16707::"NIL and HR HPV+","NIL and HR HPV negative"}   Obstetric History: [id number]  Family History:  Family History  Problem Relation Age of Onset   Hyperthyroidism Mother    Cancer Father        Skin   Colon cancer Paternal Aunt 77   Heart disease Paternal  Grandfather    Bone cancer Other    Breast cancer Other        82s   Prostate cancer Other     Social History:  Social History   Socioeconomic History   Marital status: Married    Spouse name: Keyle Ursin   Number of children: 2   Years of education: Not on file   Highest education level: Not on file  Occupational History   Not on file  Tobacco Use   Smoking status: Never   Smokeless tobacco: Never  Vaping Use   Vaping Use: Never used  Substance and Sexual Activity   Alcohol use: Never   Drug use: Never   Sexual activity: Yes    Birth control/protection: Pill  Other Topics Concern   Not on file  Social History Narrative   Not on file   Social Determinants of Health   Financial Resource Strain: Not on file  Food Insecurity: Not on file  Transportation Needs: Not on file  Physical Activity: Not on file  Stress: Not on file  Social Connections: Not on file  Intimate Partner Violence: Not on file    Allergies:  Allergies  Allergen Reactions   Amoxicillin Hives    Medications: Prior to Admission medications   Medication Sig Start Date End Date Taking? Authorizing Provider  ibuprofen (ADVIL) 600 MG tablet Take 1 tablet (600 mg total) by mouth every 6 (six) hours. 05/06/21  Yes Dorota Heinrichs, Nunzio Cobbs, CNM  acetaminophen (TYLENOL) 325 MG tablet Take 2 tablets (650 mg total) by mouth every 4 (four) hours as needed (for pain scale < 4). Patient not taking: Reported on 06/24/2022 05/06/21   DominicNunzio Cobbs, CNM  azithromycin (ZITHROMAX) 250 MG tablet z-pack - take as  directed for 5 days Patient not taking: Reported on 06/24/2022 10/02/21   Ronnell Freshwater, NP  etonogestrel-ethinyl estradiol (NUVARING) 0.12-0.015 MG/24HR vaginal ring Insert vaginally and leave in place for 3 consecutive weeks, then remove for 1 week. 06/24/22   Zameria Vogl, Nunzio Cobbs, CNM  predniSONE (DELTASONE) 20 MG tablet Take 1 tablet (20 mg total) by mouth daily with breakfast. Patient not taking:  Reported on 06/24/2022 08/22/21   Lorrene Reid, PA-C    Physical Exam Vitals: Blood pressure 121/82, pulse 75, height '5\' 4"'$  (1.626 m), weight 151 lb 11.2 oz (68.8 kg), last menstrual period 06/04/2022, not currently breastfeeding.  General: NAD HEENT: normocephalic, anicteric Thyroid: no enlargement, no palpable nodules Pulmonary: No increased work of breathing, CTAB Cardiovascular: RRR, distal pulses 2+ Breast: Breast symmetrical, no tenderness, no palpable nodules or masses, no skin or nipple retraction present, no nipple discharge.  No axillary or supraclavicular lymphadenopathy. Abdomen: NABS, soft, non-tender, non-distended.  Umbilicus without lesions.  No hepatomegaly, splenomegaly or masses palpable. No evidence of hernia  Genitourinary:  External: Normal external female genitalia.  Normal urethral meatus, normal Bartholin's and Skene's glands.    Vagina: Normal vaginal mucosa, no evidence of prolapse.    Cervix: Grossly normal in appearance, no bleeding  Uterus: Non-enlarged, mobile, normal contour.  No CMT  Adnexa: ovaries non-enlarged, no adnexal masses  Rectal: deferred  Lymphatic: no evidence of inguinal lymphadenopathy Extremities: no edema, erythema, or tenderness Neurologic: Grossly intact Psychiatric: mood appropriate, affect full  Female chaperone present for pelvic and breast  portions of the physical exam    Assessment: 27 y.o. [id number] routine annual exam  Plan: Problem List Items Addressed This Visit   None Visit Diagnoses     Well woman exam    -  Primary   Relevant Orders   Cervicovaginal ancillary only   Cytology - PAP   Encounter for prescription for nuvaring       Relevant Medications   etonogestrel-ethinyl estradiol (NUVARING) 0.12-0.015 MG/24HR vaginal ring   Cervical cancer screening       Relevant Orders   Cytology - PAP   Intermenstrual bleeding       Relevant Orders   Cervicovaginal ancillary only       1) 4) Gardasil Series discussed  and if applicable offered to patient - Patient {HAS HAS [id number] previously completed 3 shot series   2) STI screening  {Blank single:19197::"was","was not"}offered and {Blank single:19197::"accepted","declined","therefore not obtained"}  3)  ASCCP guidelines and rational discussed.  Patient opts for {Blank single:19197::"***","every 5 years","every 3 years","yearly","discontinue age >65","discontinue secondary to prior hysterectomy"} screening interval  4) Contraception - the patient is currently using  {method:5051}.  She is {Blank single:19197::"happy with her current form of contraception and plans to continue","interested in changing to ***","interested in starting Contraception: ***","not currently in need of contraception secondary to being sterile","attempting to conceive in the near future"} We discussed safe sex practices to reduce her furture risk of STI's.    5) No follow-ups on file.   Roberto Scales, [facility], [facility] 06/24/2022, 2:26 PM

## 2022-06-26 LAB — CERVICOVAGINAL ANCILLARY ONLY
Bacterial Vaginitis (gardnerella): NEGATIVE
Candida Glabrata: NEGATIVE
Candida Vaginitis: NEGATIVE
Comment: NEGATIVE
Comment: NEGATIVE
Comment: NEGATIVE
Comment: NEGATIVE
Trichomonas: NEGATIVE

## 2022-07-02 LAB — CYTOLOGY - PAP
Chlamydia: NEGATIVE
Comment: NEGATIVE
Comment: NEGATIVE
Comment: NORMAL
Diagnosis: UNDETERMINED — AB
High risk HPV: NEGATIVE
Neisseria Gonorrhea: NEGATIVE

## 2022-08-05 ENCOUNTER — Telehealth: Payer: Self-pay

## 2022-08-05 NOTE — Telephone Encounter (Signed)
Madison Lee, patient would like to talk to you about changing her birth control. Does she need an appointment or can this be discussed via mychart/telephone?

## 2022-08-06 NOTE — Telephone Encounter (Signed)
I contacted the patient via phone. The patient confirmed 5/3 my chart visit with LMD.

## 2022-08-21 ENCOUNTER — Telehealth (INDEPENDENT_AMBULATORY_CARE_PROVIDER_SITE_OTHER): Payer: Medicaid Other | Admitting: Licensed Practical Nurse

## 2022-08-21 ENCOUNTER — Encounter: Payer: Self-pay | Admitting: Licensed Practical Nurse

## 2022-08-21 DIAGNOSIS — Z3041 Encounter for surveillance of contraceptive pills: Secondary | ICD-10-CM

## 2022-08-21 MED ORDER — DROSPIRENONE-ETHINYL ESTRADIOL 3-0.02 MG PO TABS: 1.0000 | ORAL_TABLET | Freq: Every day | ORAL | 11 refills | Status: DC

## 2022-08-21 NOTE — Progress Notes (Signed)
Virtual Visit via Video Note  I connected with Madison Lee on 08/21/22 at  3:35 PM EDT by a video enabled telemedicine application and verified that I am speaking with the correct person using two identifiers.  Location: Patient: at her house in [place], [place] Provider: office in [facility] [place], [place]    I discussed the limitations of evaluation and management by telemedicine and the availability of in person appointments. The patient expressed understanding and agreed to proceed.  History of Present Illness: Pt using Nuva Ring for contraception, she notices that her mood changes to "rage" the week prior to the start of her period. She does not have this shift in mood any other time. She would like to discuss other options of birth control. She would like something that she can "control", she would prefer having a cycle, she is ok with taking something daily. She is not interested in an IUD. She would like something more reliable like hormones versus condoms, diaphragm or spermicide.   Pt is due to get her cycle over the next few days  Denies tobacco or vape use  Denies hx of CHTN    Observations/Objective: Gen: NAD,   Assessment and Plan: Contraceptive counseling   Follow Up Instructions: Discussed options, including switching to an OCP with a higher estrogen dose, continuous use pills, patch or IUD.  - will Start Yaz, script sent to pharmacy. Risks/benefits/side effects reviewed -no need for fu unless pt is not happy Yaz   I discussed the assessment and treatment plan with the patient. The patient was provided an opportunity to ask questions and all were answered. The patient agreed with the plan and demonstrated an understanding of the instructions.   The patient was advised to call back or seek an in-person evaluation if the symptoms worsen or if the condition fails to improve as anticipated.  I provided 15 minutes of non-face-to-face time during this encounter.   Ellouise Newer Tishana Clinkenbeard,  CNM

## 2022-09-22 DIAGNOSIS — J0140 Acute pansinusitis, unspecified: Secondary | ICD-10-CM | POA: Diagnosis not present

## 2022-10-05 ENCOUNTER — Other Ambulatory Visit: Payer: Self-pay | Admitting: Licensed Practical Nurse

## 2022-10-05 DIAGNOSIS — Z30018 Encounter for initial prescription of other contraceptives: Secondary | ICD-10-CM

## 2022-10-06 ENCOUNTER — Telehealth: Payer: Self-pay

## 2022-10-06 NOTE — Telephone Encounter (Signed)
Pt calling; would like to go back on the nuvaring; doesn't like the way Lowella Bandy makes her feel. Would like a 90d supply.  [phone]

## 2022-10-07 ENCOUNTER — Other Ambulatory Visit: Payer: Self-pay | Admitting: Licensed Practical Nurse

## 2022-10-07 DIAGNOSIS — Z3040 Encounter for surveillance of contraceptives, unspecified: Secondary | ICD-10-CM

## 2022-10-07 MED ORDER — ETONOGESTREL-ETHINYL ESTRADIOL 0.12-0.015 MG/24HR VA RING: VAGINAL_RING | VAGINAL | 4 refills | Status: DC

## 2022-10-07 NOTE — Telephone Encounter (Signed)
Pt aware.

## 2022-10-07 NOTE — Progress Notes (Signed)
Pt requesting switch back to Nuva ring from yaz. Nuva ring for 90 day supply ordered.  Carie Caddy, CNM   [facility]  10/07/22  9:21 AM

## 2022-10-14 IMAGING — US US PELVIS COMPLETE WITH TRANSVAGINAL
1 series · 14 of 25 positions shown · non-contrast
Comparison: None

CLINICAL DATA: Abnormal uterine bleeding

EXAM:
TRANSABDOMINAL AND TRANSVAGINAL ULTRASOUND OF PELVIS
TECHNIQUE: Both transabdominal and transvaginal ultrasound examinations of the
pelvis were performed. Transabdominal technique was performed for
global imaging of the pelvis including uterus, ovaries, adnexal
regions, and pelvic cul-de-sac. It was necessary to proceed with
endovaginal exam following the transabdominal exam to visualize the
uterus endometrium ovaries.

[Series 1: us pelvic complete with transvaginal · 14 of 118 slices shown]
[im 1/118]
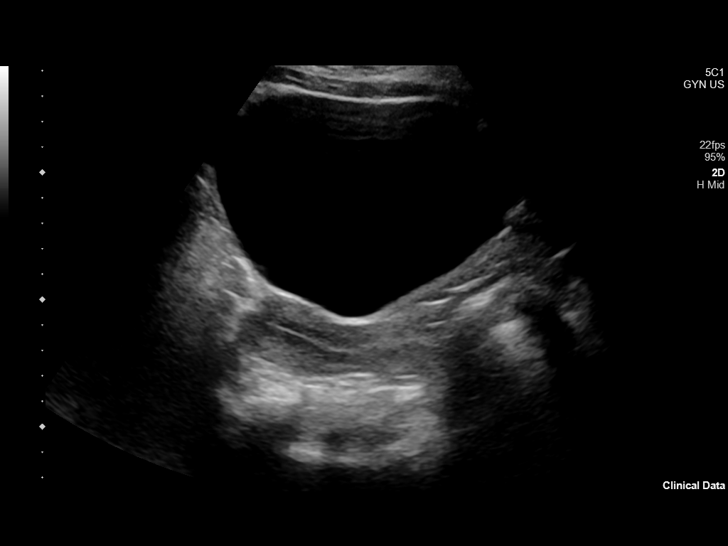
[im 10/118]
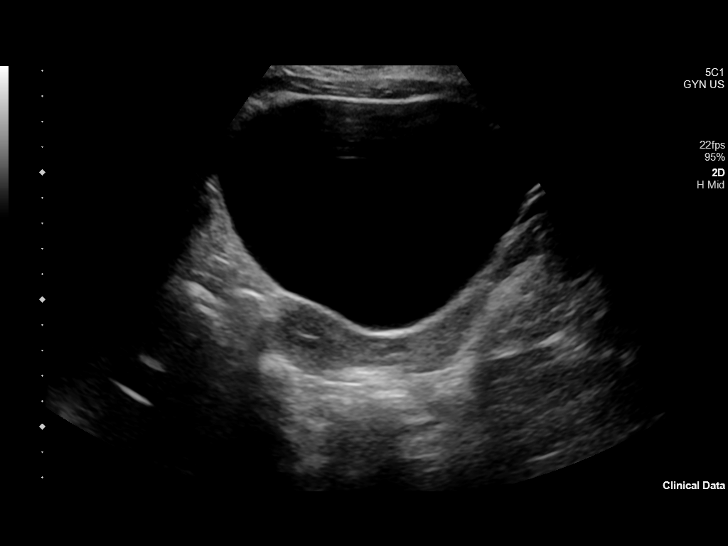
[im 20/118]
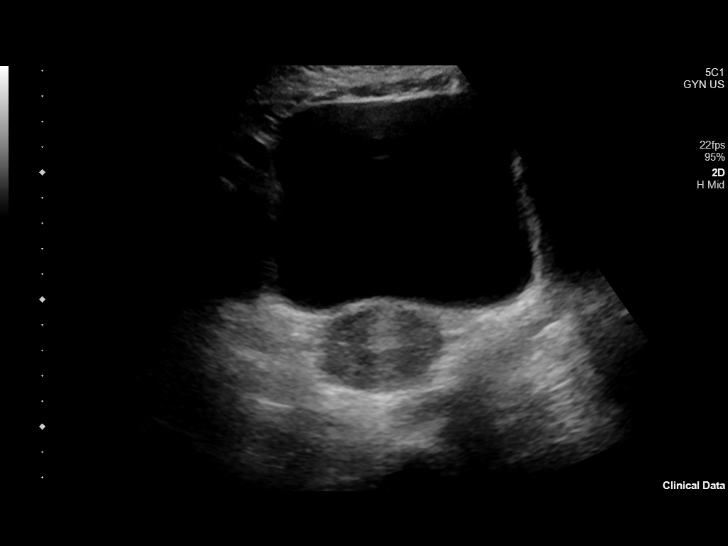
[im 30/118]
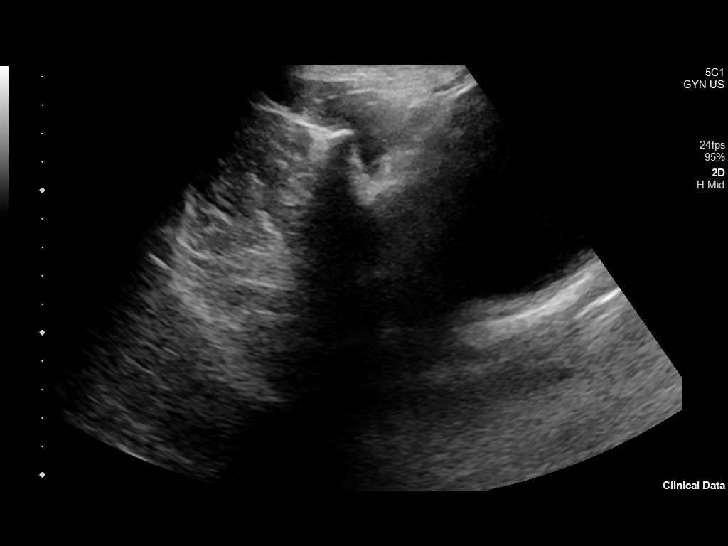
[im 40/118]
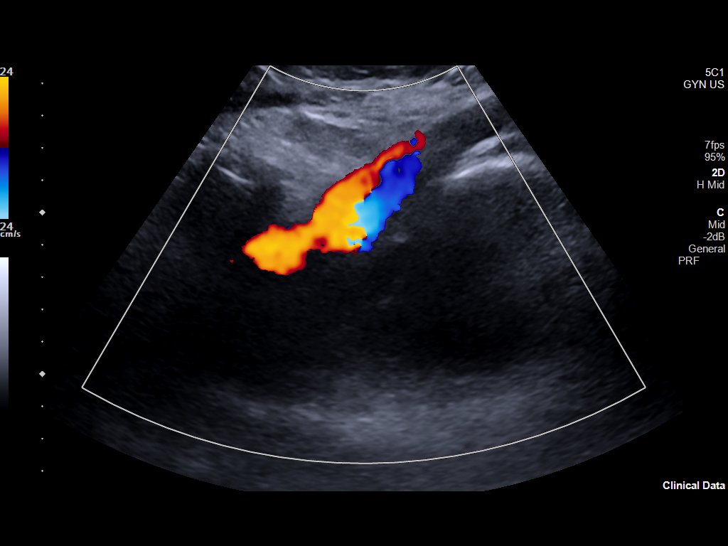
[im 44/118]
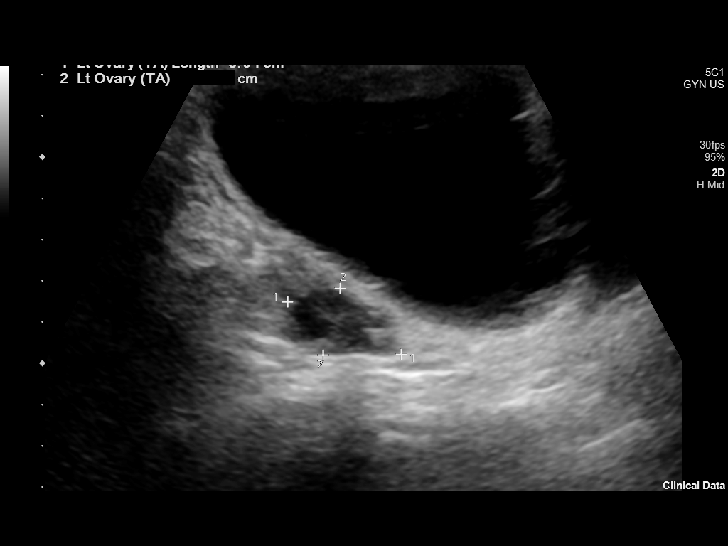
[im 54/118]
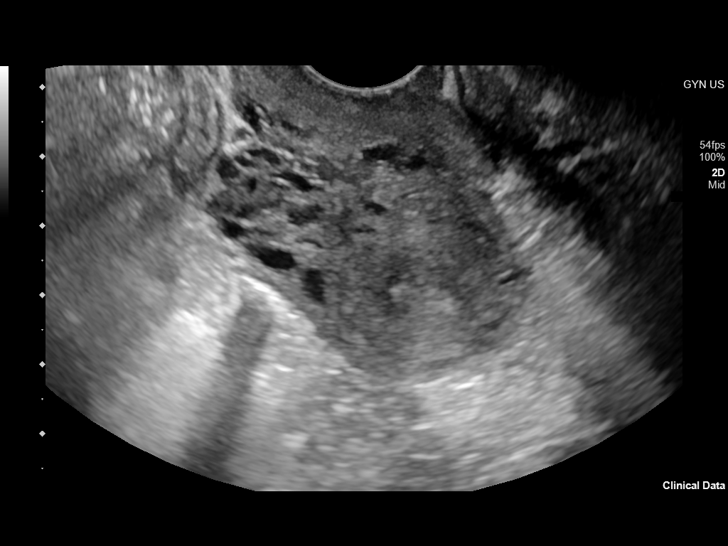
[im 64/118]
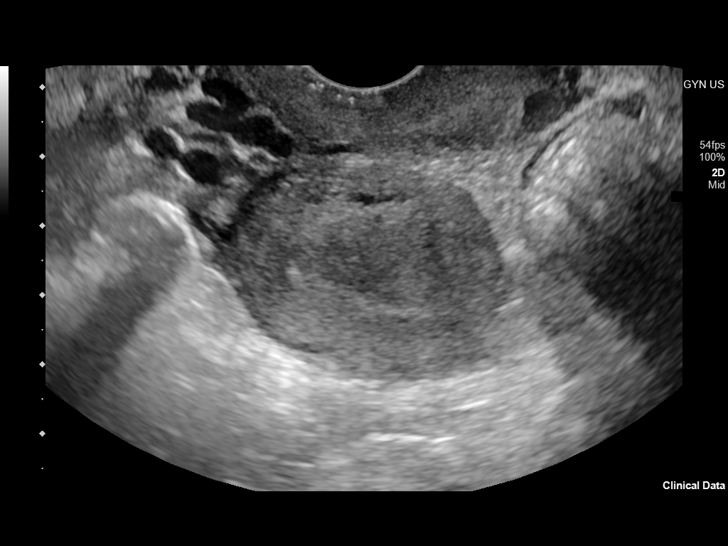
[im 74/118]
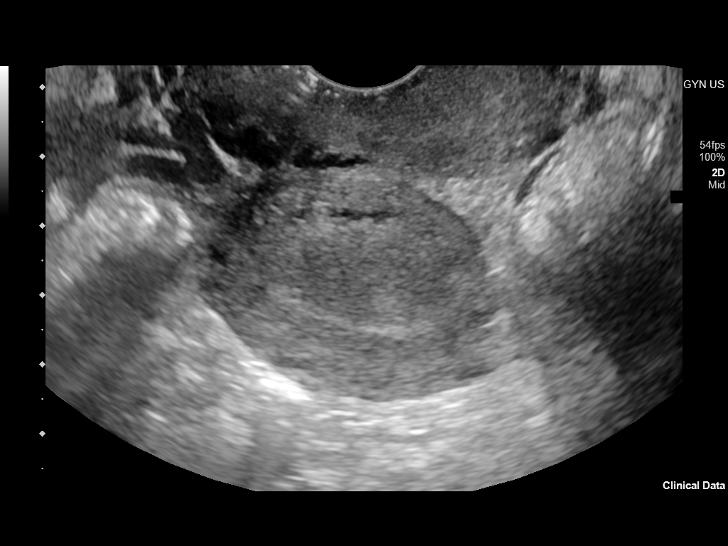
[im 79/118]
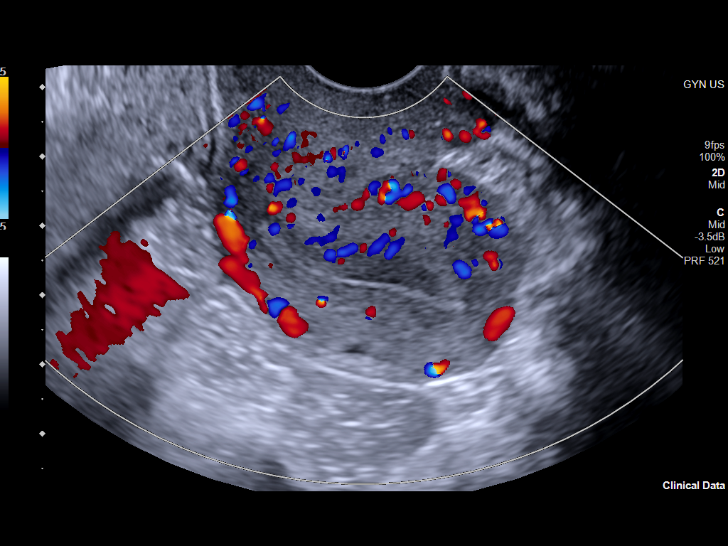
[im 88/118]
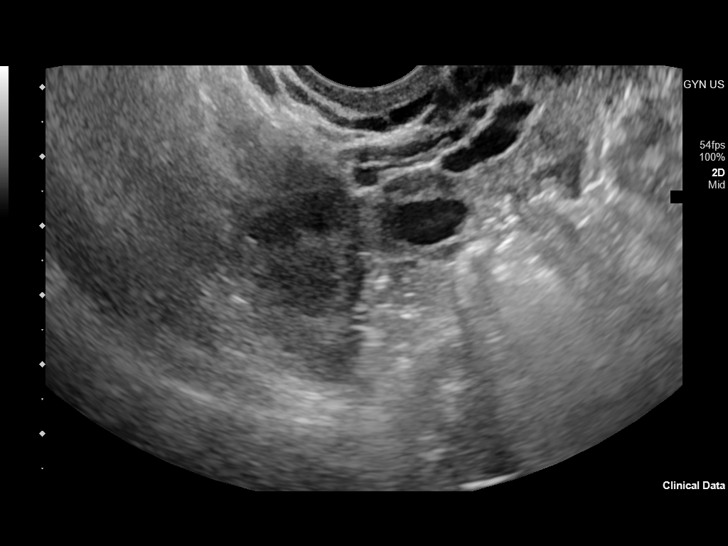
[im 98/118]
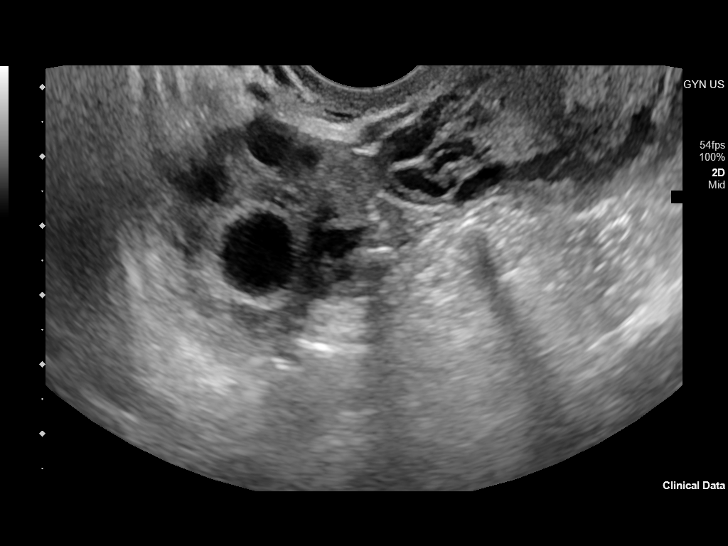
[im 108/118]
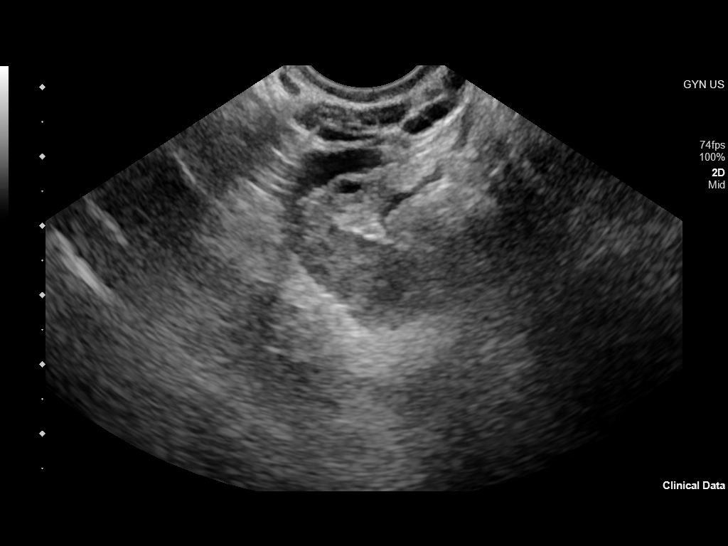
[im 118/118]
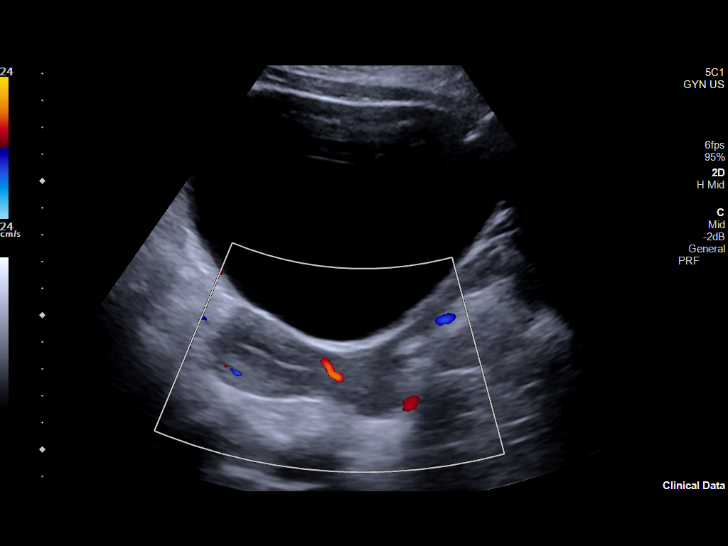

[14 of 25 positions shown; findings below may reference images not displayed]

FINDINGS: Uterus

Measurements: 8.3 x 2.9 x 4.6 cm = volume: 59.5 mL. No fibroids or
other mass visualized.

Endometrium

Thickness: 3 mm.  No focal abnormality visualized.

Right ovary

Measurements: 3.8 x 2.2 x 2.2 cm = volume: 11.4 mL. Normal
appearance/no adnexal mass.

Left ovary

Measurements: 2.4 x 1.2 x 1.9 cm = volume: 2.9 mL. Normal
appearance/no adnexal mass.

Other findings

No abnormal free fluid.
IMPRESSION: Negative pelvic ultrasound

## 2022-12-12 ENCOUNTER — Ambulatory Visit
Admission: EM | Admit: 2022-12-12 | Discharge: 2022-12-12 | Disposition: A | Discharge status: Home / Self Care | Payer: Medicaid Other | Attending: Emergency Medicine | Admitting: Emergency Medicine

## 2022-12-12 DIAGNOSIS — Z3202 Encounter for pregnancy test, result negative: Secondary | ICD-10-CM

## 2022-12-12 DIAGNOSIS — R300 Dysuria: Secondary | ICD-10-CM | POA: Diagnosis not present

## 2022-12-12 LAB — POCT URINALYSIS DIP (MANUAL ENTRY)
Bilirubin, UA: NEGATIVE
Glucose, UA: NEGATIVE mg/dL
Ketones, POC UA: NEGATIVE mg/dL
Nitrite, UA: NEGATIVE
Protein Ur, POC: NEGATIVE mg/dL
Spec Grav, UA: <=1.005 — AB (ref 1.010–1.025)
Urobilinogen, UA: 0.2 E.U./dL
pH, UA: 5.5 (ref 5.0–8.0)

## 2022-12-12 LAB — POCT URINE PREGNANCY: Preg Test, Ur: NEGATIVE

## 2022-12-12 MED ORDER — CEPHALEXIN 500 MG PO CAPS: 500.0000 mg | ORAL_CAPSULE | Freq: Two times a day (BID) | ORAL | 0 refills | Status: AC

## 2022-12-12 NOTE — ED Triage Notes (Signed)
Patient to Urgent Care with complaints of dysuria/ lower back pain/ hematuria. Denies any known fevers.   Reports that her symptoms started yesterday. Reports seeing small blood clots in her urine yesterday (some improvement today).

## 2022-12-12 NOTE — ED Provider Notes (Signed)
Madison Lee    CSN: [id number] Arrival date & time: 12/12/22  0915      History   Chief Complaint Chief Complaint  Patient presents with   Back Pain   Hematuria    HPI Madison Lee is a 27 y.o. female.  Patient presents with 1 day history of dysuria, hematuria, low back pain.  She is currently on her menstrual cycle.  She denies fever, abdominal pain, flank pain, vaginal discharge, pelvic pain, or other symptoms.  The history is provided by the patient and medical records.    Past Medical History:  Diagnosis Date   Asthma     Patient Active Problem List   Diagnosis Date Noted   Acute non-recurrent pansinusitis 10/12/2021   SVD (spontaneous vaginal delivery)     Past Surgical History:  Procedure Laterality Date   NO PAST SURGERIES      OB History     Gravida  2   Para  2   Term  2   Preterm      AB      Living  2      SAB      IAB      Ectopic      Multiple  0   Live Births  2            Home Medications    Prior to Admission medications   Medication Sig Start Date End Date Taking? Authorizing Provider  cephALEXin (KEFLEX) 500 MG capsule Take 1 capsule (500 mg total) by mouth 2 (two) times daily for 5 days. 12/12/22 12/17/22 Yes Mickie Bail, NP  etonogestrel-ethinyl estradiol (NUVARING) 0.12-0.015 MG/24HR vaginal ring Insert vaginally and leave in place for 3 consecutive weeks, then remove for 1 week. 10/07/22   Dominic, Courtney Heys, CNM  acetaminophen (TYLENOL) 325 MG tablet Take 2 tablets (650 mg total) by mouth every 4 (four) hours as needed (for pain scale < 4). Patient not taking: Reported on 06/24/2022 05/06/21   Dominic, Courtney Heys, CNM  azithromycin [facility]) 250 MG tablet z-pack - take as directed for 5 days Patient not taking: Reported on 06/24/2022 10/02/21   Carlean Jews, NP  ibuprofen (ADVIL) 600 MG tablet Take 1 tablet (600 mg total) by mouth every 6 (six) hours. Patient not taking: Reported on 08/21/2022  05/06/21   Dominic, Courtney Heys, CNM  predniSONE (DELTASONE) 20 MG tablet Take 1 tablet (20 mg total) by mouth daily with breakfast. Patient not taking: Reported on 06/24/2022 08/22/21   Mayer Masker, PA-C    Family History Family History  Problem Relation Age of Onset   Hyperthyroidism Mother    Cancer Father        Skin   Colon cancer Paternal Aunt 49   Heart disease Paternal Grandfather    Bone cancer Other    Breast cancer Other        27s   Prostate cancer Other     Social History Social History   Tobacco Use   Smoking status: Never   Smokeless tobacco: Never  Vaping Use   Vaping status: Never Used  Substance Use Topics   Alcohol use: Never   Drug use: Never     Allergies   Amoxicillin   Review of Systems Review of Systems  Constitutional:  Negative for chills and fever.  Gastrointestinal:  Negative for abdominal pain, nausea and vomiting.  Genitourinary:  Positive for dysuria and hematuria. Negative for flank pain, frequency, pelvic pain  and vaginal discharge.     Physical Exam Triage Vital Signs ED Triage Vitals  Encounter Vitals Group     BP 12/12/22 1023 108/77     Systolic BP Percentile --      Diastolic BP Percentile --      Pulse Rate 12/12/22 1014 90     Resp 12/12/22 1014 18     Temp 12/12/22 1014 98.6 F (37 C)     Temp src --      SpO2 12/12/22 1014 98 %     Weight --      Height --      Head Circumference --      Peak Flow --      Pain Score 12/12/22 1019 4     Pain Loc --      Pain Education --      Exclude from Growth Chart --    No data found.  Updated Vital Signs BP 108/77   Pulse 90   Temp 98.6 F (37 C)   Resp 18   LMP 12/07/2022   SpO2 98%   Breastfeeding No   Visual Acuity Right Eye Distance:   Left Eye Distance:   Bilateral Distance:    Right Eye Near:   Left Eye Near:    Bilateral Near:     Physical Exam Constitutional:      General: She is not in acute distress. HENT:     Mouth/Throat:     Mouth:  Mucous membranes are moist.  Cardiovascular:     Rate and Rhythm: Normal rate and regular rhythm.     Heart sounds: Normal heart sounds.  Pulmonary:     Effort: Pulmonary effort is normal. No respiratory distress.     Breath sounds: Normal breath sounds.  Abdominal:     General: Bowel sounds are normal.     Palpations: Abdomen is soft.     Tenderness: There is no abdominal tenderness. There is no right CVA tenderness, left CVA tenderness, guarding or rebound.  Skin:    General: Skin is warm and dry.  Neurological:     Mental Status: She is alert.      UC Treatments / Results  Labs (all labs ordered are listed, but only abnormal results are displayed) Labs Reviewed  POCT URINALYSIS DIP (MANUAL ENTRY) - Abnormal; Notable for the following components:      Result Value   Clarity, UA cloudy (*)    Spec Grav, UA <=1.005 (*)    Blood, UA large (*)    Leukocytes, UA Small (1+) (*)    All other components within normal limits  URINE CULTURE  POCT URINE PREGNANCY    EKG   Radiology No results found.  Procedures Procedures (including critical care time)  Medications Ordered in UC Medications - No data to display  Initial Impression / Assessment and Plan / UC Course  I have reviewed the triage vital signs and the nursing notes.  Pertinent labs & imaging results that were available during my care of the patient were reviewed by me and considered in my medical decision making (see chart for details).   Dysuria, negative pregnancy test.  Treating with Keflex. Urine culture pending. Discussed with patient that we will call her if the urine culture shows the need to change or discontinue the antibiotic. Instructed her to follow-up with her PCP for recheck of hematuria. Patient agrees to plan of care.      Final Clinical Impressions(s) / UC Diagnoses  Final diagnoses:  Dysuria  Negative pregnancy test     Discharge Instructions      Take the antibiotic as directed.   The urine culture is pending.  We will call you if it shows the need to change or discontinue your antibiotic.    Follow up with your primary care provider if your symptoms are not improving.        ED Prescriptions     Medication Sig Dispense Auth. Provider   cephALEXin (KEFLEX) 500 MG capsule Take 1 capsule (500 mg total) by mouth 2 (two) times daily for 5 days. 10 capsule Mickie Bail, NP      PDMP not reviewed this encounter.   Mickie Bail, NP 12/12/22 1050

## 2022-12-12 NOTE — Discharge Instructions (Addendum)
Take the antibiotic as directed.  The urine culture is pending.  We will call you if it shows the need to change or discontinue your antibiotic.    Follow up with your primary care provider if your symptoms are not improving.    

## 2022-12-14 LAB — URINE CULTURE: Culture: >=100000 — AB

## 2023-01-20 DIAGNOSIS — R3915 Urgency of urination: Secondary | ICD-10-CM | POA: Diagnosis not present

## 2023-01-20 DIAGNOSIS — R4589 Other symptoms and signs involving emotional state: Secondary | ICD-10-CM | POA: Diagnosis not present

## 2023-01-20 DIAGNOSIS — Z202 Contact with and (suspected) exposure to infections with a predominantly sexual mode of transmission: Secondary | ICD-10-CM | POA: Diagnosis not present

## 2023-01-20 DIAGNOSIS — Z719 Counseling, unspecified: Secondary | ICD-10-CM | POA: Diagnosis not present

## 2023-01-24 ENCOUNTER — Emergency Department
Admission: EM | Admit: 2023-01-24 | Discharge: 2023-01-24 | Disposition: A | Discharge status: Home / Self Care | Payer: Medicaid Other | Attending: Emergency Medicine | Admitting: Emergency Medicine

## 2023-01-24 ENCOUNTER — Other Ambulatory Visit: Payer: Self-pay

## 2023-01-24 ENCOUNTER — Encounter: Payer: Self-pay | Admitting: Intensive Care

## 2023-01-24 DIAGNOSIS — N3000 Acute cystitis without hematuria: Secondary | ICD-10-CM | POA: Diagnosis not present

## 2023-01-24 DIAGNOSIS — R350 Frequency of micturition: Secondary | ICD-10-CM | POA: Diagnosis present

## 2023-01-24 LAB — CHLAMYDIA/NGC RT PCR (ARMC ONLY)
Chlamydia Tr: NOT DETECTED
N gonorrhoeae: NOT DETECTED

## 2023-01-24 LAB — URINALYSIS, ROUTINE W REFLEX MICROSCOPIC
Bilirubin Urine: NEGATIVE
Glucose, UA: NEGATIVE mg/dL
Ketones, ur: NEGATIVE mg/dL
Nitrite: NEGATIVE
Protein, ur: NEGATIVE mg/dL
Specific Gravity, Urine: 1.018 (ref 1.005–1.030)
WBC, UA: >50 WBC/hpf (ref 0–5)
pH: 5 (ref 5.0–8.0)

## 2023-01-24 LAB — WET PREP, GENITAL
Clue Cells Wet Prep HPF POC: NONE SEEN
Sperm: NONE SEEN
Trich, Wet Prep: NONE SEEN
WBC, Wet Prep HPF POC: <10 (ref <10)
Yeast Wet Prep HPF POC: NONE SEEN

## 2023-01-24 LAB — POC URINE PREG, ED: Preg Test, Ur: NEGATIVE

## 2023-01-24 MED ORDER — NITROFURANTOIN MONOHYD MACRO 100 MG PO CAPS: 100.0000 mg | ORAL_CAPSULE | Freq: Two times a day (BID) | ORAL | 0 refills | Status: AC

## 2023-01-24 NOTE — Discharge Instructions (Addendum)
The gonorrhea, chlamydia, and wet prep are negative.  Your urine is suspicious for urinary tract infection.  Please take the antibiotic as prescribed.  Please return for any new, worsening, or change in symptoms or other concerns.  It was a pleasure caring for you today.

## 2023-01-24 NOTE — ED Triage Notes (Signed)
Patient presents with urine frequency and pressure during urination. Also reports lower back pressure.   Reports last UTI in August

## 2023-01-24 NOTE — ED Provider Notes (Signed)
[facility] Provider Note    None    (approximate)   History   Recurrent UTI   HPI  Madison Lee is a 27 y.o. female who presents today for evaluation of urinary frequency and urgency for the past couple of days.  Patient reports that she had a urinary tract infection in August and this feels similar.  She has not had any fevers or chills.  No nausea or vomiting.  She would like to be tested for STDs because she reports that she was separated from her husband for several months.  Patient Active Problem List   Diagnosis Date Noted   Acute non-recurrent pansinusitis 10/12/2021   SVD (spontaneous vaginal delivery)           Physical Exam   Triage Vital Signs: ED Triage Vitals  Encounter Vitals Group     BP 01/24/23 1029 (!) 140/99     Systolic BP Percentile --      Diastolic BP Percentile --      Pulse Rate 01/24/23 1029 70     Resp 01/24/23 1029 16     Temp 01/24/23 1029 98.3 F (36.8 C)     Temp Source 01/24/23 1029 Oral     SpO2 01/24/23 1029 100 %     Weight 01/24/23 1030 139 lb (63 kg)     Height 01/24/23 1030 5\' 4"  (1.626 m)     Head Circumference --      Peak Flow --      Pain Score 01/24/23 1030 3     Pain Loc --      Pain Education --      Exclude from Growth Chart --     Most recent vital signs: Vitals:   01/24/23 1029  BP: (!) 140/99  Pulse: 70  Resp: 16  Temp: 98.3 F (36.8 C)  SpO2: 100%    Physical Exam Vitals and nursing note reviewed.  Constitutional:      General: Awake and alert. No acute distress.    Appearance: Normal appearance. The patient is normal weight.  HENT:     Head: Normocephalic and atraumatic.     Mouth: Mucous membranes are moist.  Eyes:     General: PERRL. Normal EOMs        Right eye: No discharge.        Left eye: No discharge.     Conjunctiva/sclera: Conjunctivae normal.  Cardiovascular:     Rate and Rhythm: Normal rate and regular rhythm.     Pulses: Normal pulses.   Pulmonary:     Effort: Pulmonary effort is normal. No respiratory distress.     Breath sounds: Normal breath sounds.  Abdominal:     Abdomen is soft. There is no abdominal tenderness. No rebound or guarding. No distention.  No CVAT Musculoskeletal:        General: No swelling. Normal range of motion.     Cervical back: Normal range of motion and neck supple.  Skin:    General: Skin is warm and dry.     Capillary Refill: Capillary refill takes less than 2 seconds.     Findings: No rash.  Neurological:     Mental Status: The patient is awake and alert.      ED Results / Procedures / Treatments   Labs (all labs ordered are listed, but only abnormal results are displayed) Labs Reviewed  URINALYSIS, ROUTINE W REFLEX MICROSCOPIC - Abnormal; Notable for the following components:  Result Value   Color, Urine YELLOW (*)    APPearance HAZY (*)    Hgb urine dipstick MODERATE (*)    Leukocytes,Ua LARGE (*)    Bacteria, UA RARE (*)    All other components within normal limits  WET PREP, GENITAL  CHLAMYDIA/NGC RT PCR ([facility] ONLY)            POC URINE PREG, ED     EKG     RADIOLOGY     PROCEDURES:  Critical Care performed:   Procedures   MEDICATIONS ORDERED IN ED: Medications - No data to display   IMPRESSION / MDM / ASSESSMENT AND PLAN / ED COURSE  I reviewed the triage vital signs and the nursing notes.   Differential diagnosis includes, but is not limited to, UTI, STD, yeast infection, bacterial vaginosis.  Patient is awake and alert, hemodynamically stable and afebrile.  She is nontoxic in appearance.  I reviewed the patient's chart.  Patient had E. coli in her urine during her last episode, which was resistant to ampicillin.  Urinalysis today reveals large leukocytes.  She agreed to STD testing and wet prep.  Gonorrhea and chlamydia is negative, wet prep is negative.  She understands that there are other transmitted diseases that exist and she should see  the health department for full panel of STD testing.  She will be treated for urinary tract infection.  She does not have any CVA tenderness, flank pain, nausea, vomiting, fevers, chills to suggest pyelonephritis.  No history of kidney stones, no flank pain, do not suspect ureteral stone or nephrolithiasis.  She was given Macrobid per her prior sensitivities.  Urine was again sent for culture.  We discussed tricked return precautions and the importance of close outpatient follow-up.  Patient or stands and agrees with plan.  She was discharged in stable condition.   Patient's presentation is most consistent with acute complicated illness / injury requiring diagnostic workup.      FINAL CLINICAL IMPRESSION(S) / ED DIAGNOSES   Final diagnoses:  Acute cystitis without hematuria     Rx / DC Orders   ED Discharge Orders          Ordered    nitrofurantoin, macrocrystal-monohydrate, (MACROBID) 100 MG capsule  2 times daily        01/24/23 1254             Note:  This document was prepared using Dragon voice recognition software and may include unintentional dictation errors.   Jackelyn Hoehn, PA-C 01/24/23 1442    Concha Se, MD 01/27/23 [phone]

## 2023-03-11 ENCOUNTER — Telehealth: Payer: Self-pay | Admitting: Licensed Practical Nurse

## 2023-03-11 NOTE — Telephone Encounter (Signed)
Patient called and scheduled an appointment with you,  She wanted me to check to see if this could be a MY CHART VIDEO appointment?  Please Advise.  Madison Lee

## 2023-03-24 ENCOUNTER — Telehealth (INDEPENDENT_AMBULATORY_CARE_PROVIDER_SITE_OTHER): Payer: Medicaid Other | Admitting: Licensed Practical Nurse

## 2023-03-24 DIAGNOSIS — Z3009 Encounter for other general counseling and advice on contraception: Secondary | ICD-10-CM

## 2023-03-24 NOTE — Progress Notes (Signed)
Virtual Visit via Video Note  I connected with Madison Lee on 03/24/23 at  2:15 PM EST by a video enabled telemedicine application and verified that I am speaking with the correct person using two identifiers.  Location: Patient: home in [place], [place]  Provider: Office in [place], [place]   I discussed the limitations of evaluation and management by telemedicine and the availability of in person appointments. The patient expressed understanding and agreed to proceed.  History of Present Illness: Madison Lee would like to discuss birth control options. She was on the Nuva ring but has experienced unscheduled bleeding. In both October and November she has had 2 cycles. This has happened in the past for her while using Nuvaring. She notices during the cycle her mood changes, she is easily irritable,  she feels she cannot be a good parent during these times. She experiences anger a lot. These symptoms only  occur with a cycle.  She ran out of the Nuva ring so has been using [name]. Madison Lee would like a birth control that she can control, she is not opposed to skipping cycles. She is uncertain about future pregnancies-because her mood is so labile. She is not interested in antidepressant medications. She has been in counseling with her partner through her church, this has helped. She has not been exercising regularly lately-she tends to get out of the habit of exercising in the fall/cold weather. She does have the ability to take time for herself but she does not do it.    Observations/Objective: GEN: NAD GAD -7 3 PHQ-9 5  Assessment and Plan: Contraceptive management   Follow Up Instructions:  -Discussed skipping cycles maybe the best way to avoid the abrupt mood changes. Reviewed continuous use pills versus IUD, the IUD is easily removed whenever the patient desires. Risks/benefits side effects reviewed. Madison Lee would like to proceed with the Mirena IUD  -Encouraged Madison Lee to reach out to her  counselor, exercise and find ways to give herself some "me time".  -Consider medication for mood if not resolved with IUD, Madison Lee may consider cyclic use of SSRI.   -Madison Lee to call and schedule IUD insertion at her convenience   I discussed the assessment and treatment plan with the patient. The patient was provided an opportunity to ask questions and all were answered. The patient agreed with the plan and demonstrated an understanding of the instructions.   The patient was advised to call back or seek an in-person evaluation if the symptoms worsen or if the condition fails to improve as anticipated.  I provided 25 minutes of non-face-to-face time during this encounter.   Ellouise Newer Niajah Sipos, CNM

## 2023-04-21 NOTE — L&D Delivery Note (Addendum)
 Delivery Note   Madison Lee is a 28 y.o. G3P2002 at [redacted]w[redacted]d Estimated Date of Delivery: 02/05/24  PRE-OPERATIVE DIAGNOSIS:  1) [redacted]w[redacted]d pregnancy.  2) GBS carrier  POST-OPERATIVE DIAGNOSIS:  1) [redacted]w[redacted]d pregnancy s/p Vaginal, Spontaneous 2) GBS carrier, untreated  Delivery Type: Vaginal, Spontaneous   Delivery Anesthesia: None  Labor Complications: None    ESTIMATED BLOOD LOSS: 75 ml    FINDINGS:   1) female infant, Apgar scores of 8 at 1 minute and 9 at 5 minutes and a birthweight of 3520 g.     SPECIMENS:   PLACENTA:   Appearance: Intact, marginal cord insertion   Removal: Spontaneous     Disposition: Per protocol  CORD BLOOD: Not Indicated  DISPOSITION:  Infant left in stable condition in the delivery room, with L&D personnel and mother  NARRATIVE SUMMARY: Labor course:  Madison Lee is a [id number] at 103w6d who presented to Labor & Delivery for labor. Her initial cervical exam was 7/100/+1. Labor proceeded spontaneously, and she was found to be completely dilated at 2240. With excellent maternal pushing effort, she birthed a viable female infant at 2250. There was not a nuchal cord. The head birthed easily, and the shoulders did not immediately follow d/t maternal position. [name] was repositioned to [place] and the shoulders were birthed without difficulty. The infant was placed skin-to-skin with [facility]. IM Pitocin  was given for AMTSL. The cord was doubly clamped and cut when pulsations ceased. The placenta delivered spontaneously and was noted to be intact with a 3VC. A perineal and vaginal examination was performed. Cervix was noted to be at the introitus after delivery. Episiotomy/Lacerations: Labial abrasion, hemostatic, not repaired Mother and baby were left in stable condition.   Eleanor Canny, CNM 02/11/2024 11:23 PM

## 2023-04-27 ENCOUNTER — Ambulatory Visit (INDEPENDENT_AMBULATORY_CARE_PROVIDER_SITE_OTHER): Payer: Medicaid Other | Admitting: Licensed Practical Nurse

## 2023-04-27 ENCOUNTER — Other Ambulatory Visit (HOSPITAL_COMMUNITY)
Admission: RE | Admit: 2023-04-27 | Discharge: 2023-04-27 | Disposition: A | Discharge status: Home / Self Care | Payer: Medicaid Other | Source: Ambulatory Visit | Attending: Licensed Practical Nurse | Admitting: Licensed Practical Nurse

## 2023-04-27 VITALS — BP 123/66 | HR 60 | Wt 145.8 lb

## 2023-04-27 DIAGNOSIS — R399 Unspecified symptoms and signs involving the genitourinary system: Secondary | ICD-10-CM | POA: Insufficient documentation

## 2023-04-27 DIAGNOSIS — N898 Other specified noninflammatory disorders of vagina: Secondary | ICD-10-CM | POA: Diagnosis not present

## 2023-04-27 DIAGNOSIS — Z30015 Encounter for initial prescription of vaginal ring hormonal contraceptive: Secondary | ICD-10-CM | POA: Diagnosis not present

## 2023-04-27 LAB — POCT URINALYSIS DIPSTICK
Bilirubin, UA: NEGATIVE
Blood, UA: POSITIVE
Glucose, UA: NEGATIVE
Ketones, UA: NEGATIVE
Leukocytes, UA: NEGATIVE
Nitrite, UA: NEGATIVE
Protein, UA: NEGATIVE
Spec Grav, UA: 1.015 (ref 1.010–1.025)
Urobilinogen, UA: 0.2 U/dL
pH, UA: 7.5 (ref 5.0–8.0)

## 2023-04-27 MED ORDER — ANNOVERA 0.013-0.15 MG/24HR VA RING: 1.0000 | VAGINAL_RING | Freq: Once | VAGINAL | 0 refills | Status: AC

## 2023-04-27 NOTE — Progress Notes (Signed)
 HPI:      [name] is a 28 y.o. [id number] who LMP was No LMP recorded., presents today for a problem visit.    Urinary Tract Infection: Patient complains of vaginal irritation/itching and burning and increased urgency, frequency, is unable to hold urine. These symptoms occur within 1 to 4 days after IC and last 1-4 days. She was treated for a UTI in Aug and Oct. In Oct she went to an urgent care that told her she did not have an UTI then a few days later she was seen in the ED for similar symptoms and they treated her for a UTI based off of an UA. They do occasionally use lubricants or condoms. Vaginal discharge has been clear. She is not breastfeeding.   Contraception: has been using NIKKI , she happened to have these on hand. Stopped Nuva ring out of concern for mood and she has some breakthrough bleeding. She has had breakthrough bleeding while on Nikki . She would like to go back to the ring. She is not interested in the IUD as she does want to have cycles and control over her contraception. She is not the best at taking pills daily.      PMHx: She  has a past medical history of Asthma. Also,  has a past surgical history that includes No past surgeries., family history includes Bone cancer in an other family member; Breast cancer in an other family member; Cancer in her father; Colon cancer (age of onset: 93) in her paternal aunt; Heart disease in her paternal grandfather; Hyperthyroidism in her mother; Prostate cancer in an other family member.,  reports that she has never smoked. She has never used smokeless tobacco. She reports current alcohol use. She reports that she does not use drugs.  She has a current medication list which includes the following prescription(s): ibuprofen , annovera , and acetaminophen . Also, is allergic to amoxicillin.  ROS see HPI   Objective: BP 123/66 (BP Location: Right Arm, Patient Position: Sitting, Cuff Size: Normal)   Pulse 60   Wt 145 lb 12.8 oz  (66.1 kg)   BMI 25.03 kg/m  Physical Exam Constitutional:      Appearance: Normal appearance.  Genitourinary:     Vulva normal.     Genitourinary Comments: Vulva tissue a little thin appearing, no lesions      No vaginal discharge.  Pulmonary:     Effort: Pulmonary effort is normal.  Abdominal:     General: Abdomen is flat.  Neurological:     Mental Status: She is alert.  Psychiatric:        Mood and Affect: Mood normal.     ASSESSMENT/PLAN:     Problem List Items Addressed This Visit   None Visit Diagnoses       Encounter for initial prescription of vaginal ring hormonal contraceptive    -  Primary   Relevant Medications   Segesterone-Ethinyl Estradiol  (ANNOVERA ) 0.15-0.013 MG/24HR RING     UTI symptoms       Relevant Orders   POCT Urinalysis Dipstick (Completed)   Urine Culture   Cervicovaginal ancillary only     Vaginal itching       Relevant Orders   Cervicovaginal ancillary only      -Reviewed symptoms could be related to the act of intercourse, friction or condoms/spermicides/lubricants can cause irritation. Consider trying different types of lubricants and condoms.   -Some people are prone to UTI's after IC, we could consider 1 times antibiotic dose  after each episode of IC.   -Urine culture sent, will develop further plan once results are in  Jinnie Cookey, [place]   [facility]  04/27/23  4:54 PM

## 2023-04-28 ENCOUNTER — Ambulatory Visit: Payer: Medicaid Other | Admitting: Family Medicine

## 2023-04-29 ENCOUNTER — Encounter: Payer: Self-pay | Admitting: Licensed Practical Nurse

## 2023-04-29 ENCOUNTER — Other Ambulatory Visit: Payer: Self-pay | Admitting: Licensed Practical Nurse

## 2023-04-29 DIAGNOSIS — N390 Urinary tract infection, site not specified: Secondary | ICD-10-CM

## 2023-04-29 LAB — CERVICOVAGINAL ANCILLARY ONLY
Bacterial Vaginitis (gardnerella): NEGATIVE
Candida Glabrata: NEGATIVE
Candida Vaginitis: NEGATIVE
Chlamydia: NEGATIVE
Comment: NEGATIVE
Comment: NEGATIVE
Comment: NEGATIVE
Comment: NEGATIVE
Comment: NEGATIVE
Comment: NORMAL
Neisseria Gonorrhea: NEGATIVE
Trichomonas: NEGATIVE

## 2023-04-29 LAB — URINE CULTURE

## 2023-04-29 MED ORDER — SULFAMETHOXAZOLE-TRIMETHOPRIM 800-160 MG PO TABS: 1.0000 | ORAL_TABLET | Freq: Two times a day (BID) | ORAL | 1 refills | Status: DC

## 2023-04-29 NOTE — Progress Notes (Signed)
 Pt seen for third UTI in a few months. Will refer to urology for frequent UTI Jannifer Hick   [facility]  04/29/23  5:27 PM

## 2023-04-29 NOTE — Progress Notes (Signed)
 Seen in office for UTI sxs urine culture shows e-coli ane GBS (25-50k). Has been treated with Macrobid x1 in the past. Will prescribe Bactrim Mychart message sent to pt Madison Lee, CNM  [facility]  04/29/23  1:54 PM

## 2023-05-04 ENCOUNTER — Telehealth: Payer: Self-pay

## 2023-05-04 NOTE — Telephone Encounter (Signed)
 Patient advised Madison Lee is out of the office until tomorrow. She is ok to wait for her to return.

## 2023-05-04 NOTE — Telephone Encounter (Signed)
 TRIAGE VOICEMAIL: Patient states her insurance doesn't cover Segesterone-Ethinyl Estradiol (ANNOVERA) 0.15-0.013 MG/24HR RING . Pharmacist advised they would reach out to [place] and advised her to contact us as well to discuss other options.

## 2023-05-06 ENCOUNTER — Other Ambulatory Visit: Payer: Self-pay

## 2023-05-06 ENCOUNTER — Other Ambulatory Visit: Payer: Self-pay | Admitting: Licensed Practical Nurse

## 2023-05-06 ENCOUNTER — Ambulatory Visit (INDEPENDENT_AMBULATORY_CARE_PROVIDER_SITE_OTHER): Payer: Medicaid Other | Admitting: Urology

## 2023-05-06 VITALS — BP 127/84 | HR 74 | Ht 64.0 in | Wt 145.0 lb

## 2023-05-06 DIAGNOSIS — N390 Urinary tract infection, site not specified: Secondary | ICD-10-CM

## 2023-05-06 DIAGNOSIS — R1011 Right upper quadrant pain: Secondary | ICD-10-CM

## 2023-05-06 LAB — URINALYSIS, COMPLETE
Bilirubin, UA: NEGATIVE
Glucose, UA: NEGATIVE
Ketones, UA: NEGATIVE
Leukocytes,UA: NEGATIVE
Nitrite, UA: NEGATIVE
Protein,UA: NEGATIVE
Specific Gravity, UA: <1.005 — ABNORMAL LOW (ref 1.005–1.030)
Urobilinogen, Ur: 0.2 mg/dL (ref 0.2–1.0)
pH, UA: 5.5 (ref 5.0–7.5)

## 2023-05-06 LAB — MICROSCOPIC EXAMINATION

## 2023-05-06 MED ORDER — ETONOGESTREL-ETHINYL ESTRADIOL 0.12-0.015 MG/24HR VA RING: VAGINAL_RING | VAGINAL | 12 refills | Status: DC

## 2023-05-06 NOTE — Progress Notes (Signed)
   05/06/23 12:34 PM   Madison Lee [birth year] [id number]  CC: Recurrent UTI  HPI: Healthy 28 year old female who reports recurrent UTIs that started in August 2024.  Symptoms are urinary frequency, sensation of incomplete emptying, and dysuria.  STD testing has been negative.  No prior imaging to review.  She did have fairly significant gross hematuria and clots at the time of the initial UTI.  Urinalysis today does show microscopic hematuria with 3-10 RBC.  She has some stress incontinence with exercising and jogging that also seems new.  Pelvic exam with GYN on 04/27/2023 was benign.  She has some intermittent right-sided flank pain.   PMH: Past Medical History:  Diagnosis Date   Asthma     Surgical History: Past Surgical History:  Procedure Laterality Date   NO PAST SURGERIES       Family History: Family History  Problem Relation Age of Onset   Hyperthyroidism Mother    Cancer Father        Skin   Colon cancer Paternal Aunt 54   Heart disease Paternal Grandfather    Bone cancer Other    Breast cancer Other        22s   Prostate cancer Other     Social History:  reports that she has never smoked. She has never used smokeless tobacco. She reports current alcohol use. She reports that she does not use drugs.  Physical Exam: BP 127/84   Pulse 74   Ht 5\' 4"  (1.626 m)   Wt 145 lb (65.8 kg)   LMP 04/18/2023   BMI 24.89 kg/m    Constitutional:  Alert and oriented, No acute distress. Cardiovascular: No clubbing, cyanosis, or edema. Respiratory: Normal respiratory effort, no increased work of breathing. GI: Abdomen is soft, nontender, nondistended, no abdominal masses  Laboratory Data: Culture data reviewed in epic, multiple E. coli UTIs  Assessment & Plan:   Healthy 28 year old female with at least 3 culture documented UTIs since August 2024.  Currently being treated with Bactrim for culture documented E. coli UTI on 04/27/2023.  We discussed the evaluation and  treatment of patients with recurrent UTIs at length.  We specifically discussed the differences between asymptomatic bacteriuria and true urinary tract infection.  We discussed the AUA definition of recurrent UTI of at least 2 culture proven symptomatic acute cystitis episodes in a 54-month period, or 3 within a 1 year period.  We discussed the importance of culture directed antibiotic treatment, and antibiotic stewardship.  First-line therapy includes nitrofurantoin(5 days), Bactrim(3 days), or fosfomycin(3 g single dose).  Possible etiologies of recurrent infection include periurethral tissue atrophy in postmenopausal woman, constipation, sexual activity, incomplete emptying, anatomic abnormalities, and even genetic predisposition.  Finally, we discussed the role of perineal hygiene, timed voiding, adequate hydration, topical vaginal estrogen, cranberry prophylaxis, and low-dose antibiotic prophylaxis.  We discussed possible atypical infections, need for CT in the setting of persistent microscopic hematuria as well as flank pain to evaluate for stone disease or anatomic abnormality, and other more rare causes including urethral diverticulum.  -Cranberry tablet prophylaxis -Follow-up atypical urine culture -CT stone protocol -Call with above results -Consider 90-day low-dose prophylaxis  -Consider further evaluation for urethral diverticulum if no improvement with above strategies   Legrand Rams, MD 05/06/2023  [facility] [address], [address] [place], [place] [address] [phone]

## 2023-05-06 NOTE — Telephone Encounter (Signed)
She advised she was on the NuvaRing which was covered. She would like to go back on that.

## 2023-05-07 NOTE — Telephone Encounter (Signed)
Patient aware.

## 2023-05-13 LAB — MYCOPLASMA / UREAPLASMA CULTURE
Mycoplasma hominis Culture: NEGATIVE
Ureaplasma urealyticum: NEGATIVE

## 2023-05-14 ENCOUNTER — Ambulatory Visit
Admission: RE | Admit: 2023-05-14 | Discharge: 2023-05-14 | Disposition: A | Discharge status: Home / Self Care | Payer: Medicaid Other | Source: Ambulatory Visit | Attending: Urology | Admitting: Urology

## 2023-05-14 DIAGNOSIS — R1011 Right upper quadrant pain: Secondary | ICD-10-CM | POA: Diagnosis present

## 2023-05-14 DIAGNOSIS — R109 Unspecified abdominal pain: Secondary | ICD-10-CM | POA: Diagnosis not present

## 2023-05-19 MED ORDER — NITROFURANTOIN MONOHYD MACRO 100 MG PO CAPS: 100.0000 mg | ORAL_CAPSULE | Freq: Every day | ORAL | 0 refills | Status: DC

## 2023-05-19 NOTE — Addendum Note (Signed)
Addended by: Frankey Shown on: 05/19/2023 02:07 PM   Modules accepted: Orders

## 2023-05-27 ENCOUNTER — Telehealth: Payer: Self-pay

## 2023-05-27 ENCOUNTER — Ambulatory Visit: Payer: Medicaid Other

## 2023-05-27 VITALS — BP 118/80 | HR 87 | Ht 64.0 in | Wt 144.6 lb

## 2023-05-27 DIAGNOSIS — Z3201 Encounter for pregnancy test, result positive: Secondary | ICD-10-CM

## 2023-05-27 DIAGNOSIS — N912 Amenorrhea, unspecified: Secondary | ICD-10-CM

## 2023-05-27 LAB — POCT URINE PREGNANCY: Preg Test, Ur: POSITIVE — AB

## 2023-05-27 NOTE — Progress Notes (Signed)
    NURSE VISIT NOTE  Subjective:    Patient ID: Madison Lee, female    DOB: September 28, 1995, 28 y.o.   MRN: [id number]  HPI  Patient is a 28 y.o. G70P2002 female who presents for evaluation of amenorrhea. She believes she could be pregnant. Pregnancy is desired. Sexual Activity: single partner, contraception: NuvaRing vaginal inserts. Current symptoms also include: frequent urination, positive home pregnancy test, and abdominal cramping . Last period was abnormal.    Objective:    BP 118/80   Pulse 87   Ht 5' 4 (1.626 m)   Wt 144 lb 9.6 oz (65.6 kg)   LMP 04/18/2023 (Exact Date)   BMI 24.82 kg/m   Lab Review  Results for orders placed or performed in visit on 05/27/23  POCT urine pregnancy  Result Value Ref Range   Preg Test, Ur Positive (A) Negative    Assessment:   1. Absence of menstruation     Plan:   Pregnancy Test: Positive  Estimated Date of Delivery: 01/23/2024. BP Cuff Measurement taken. Cuff Size Adult Small Encouraged well-balanced diet, plenty of rest when needed, pre-natal vitamins daily and walking for exercise.  Discussed self-help for nausea, avoiding OTC medications until consulting provider or pharmacist, other than Tylenol  as needed, minimal caffeine (1-2 cups daily) and avoiding alcohol.   She will schedule her nurse visit @ 7-[redacted] wks pregnant, u/s for dating @10  wk, and NOB visit at [redacted] wk pregnant.    Feel free to call with any questions.      Rollo JINNY Maxin, CMA

## 2023-05-27 NOTE — Patient Instructions (Signed)
 First Trimester of Pregnancy  The first trimester of pregnancy starts on the first day of your last monthly period until the end of week 13. This is months 1 through 3 of pregnancy. A week after a sperm fertilizes an egg, the egg will implant into the wall of the uterus and begin to develop into a baby. Body changes during your first trimester Your body goes through many changes during pregnancy. The changes usually return to normal after your baby is born. Physical changes Your breasts may grow larger and may hurt. The area around your nipples may get darker. Your periods will stop. Your hair and nails may grow faster. You may pee more often. Health changes You may tire easily. Your gums may bleed and may be sensitive when you brush and floss. You may not feel hungry. You may have heartburn. You may throw up or feel like you may throw up. You may want to eat some foods, but not others. You may have headaches. You may have trouble pooping (constipation). Other changes Your emotions may change from day to day. You may have more dreams. Follow these instructions at home: Medicines Talk to your health care provider if you're taking medicines. Ask if the medicines are safe to take during pregnancy. Your provider may change the medicines that you take. Do not take any medicines unless told to by your provider. Take a prenatal vitamin that has at least 600 micrograms (mcg) of folic acid. Do not use herbal medicines, illegal substances, or medicines that are not approved by your provider. Eating and drinking While you're pregnant your body needs extra food for your growing baby. Talk with your provider about what to eat while pregnant. Activity Most women are able to exercise during pregnancy. Exercises may need to change as your pregnancy goes on. Talk to your provider about your activities and exercise routines. Relieving pain and discomfort Wear a good, supportive bra if your breasts  hurt. Rest with your legs raised if you have leg cramps or low back pain. Safety Wear your seatbelt at all times when you're in a car. Talk to your provider if someone hits you, hurts you, or yells at you. Talk with your provider if you're feeling sad or have thoughts of hurting yourself. Lifestyle Certain things can be harmful while you're pregnant. Follow these rules: Do not use hot tubs, steam rooms, or saunas. Do not douche. Do not use tampons or scented pads. Do not drink alcohol,smoke, vape, or use products with nicotine or tobacco in them. If you need help quitting, talk with your provider. Avoid cat litter boxes and soil used by cats. These things carry germs that can cause harm to your pregnancy and your baby. General instructions Keep all follow-up visits. It helps you and your unborn baby stay as healthy as possible. Write down your questions. Take them to your visits. Your provider will: Talk with you about your overall health. Give you advice or refer you to specialists who can help with different needs, including: Prenatal education classes. Mental health and counseling. Foods and healthy eating. Ask for help if you need help with food. Call your dentist and ask to be seen. Brush your teeth with a soft toothbrush. Floss gently. Where to find more information American Pregnancy Association: americanpregnancy.org [employer]: acog.org Office on [employer] Health: [employer] Contact a health care provider if: You feel dizzy, faint, or have a fever. You vomit or have watery poop (diarrhea) for 2  days or more. You have abnormal discharge or bleeding from your vagina. You have pain when you pee or your pee smells bad. You have cramps, pain, or pressure in your belly area. Get help right away if: You have trouble breathing or chest pain. You have any kind of injury, such as from a fall or a car crash. These symptoms may be an  emergency. Get help right away. Call 911. Do not wait to see if the symptoms will go away. Do not drive yourself to the hospital. This information is not intended to replace advice given to you by your health care provider. Make sure you discuss any questions you have with your health care provider. Document Revised: 01/07/2023 Document Reviewed: 08/07/2022 Elsevier Patient Education  2024 Elsevier Inc.  Commonly Asked Questions During Pregnancy  Cats: A parasite can be excreted in cat feces.  To avoid exposure you need to have another person empty the little box.  If you must empty the litter box you will need to wear gloves.  Wash your hands after handling your cat.  This parasite can also be found in raw or undercooked meat so this should also be avoided.  Colds, Sore Throats, Flu: Please check your medication sheet to see what you can take for symptoms.  If your symptoms are unrelieved by these medications please call the office.  Dental Work: Most any dental work Agricultural consultant recommends is permitted.  X-rays should only be taken during the first trimester if absolutely necessary.  Your abdomen should be shielded with a lead apron during all x-rays.  Please notify your provider prior to receiving any x-rays.  Novocaine is fine; gas is not recommended.  If your dentist requires a note from us  prior to dental work please call the office and we will provide one for you.  Exercise: Exercise is an important part of staying healthy during your pregnancy.  You may continue most exercises you were accustomed to prior to pregnancy.  Later in your pregnancy you will most likely notice you have difficulty with activities requiring balance like riding a bicycle.  It is important that you listen to your body and avoid activities that put you at a higher risk of falling.  Adequate rest and staying well hydrated are a must!  If you have questions about the safety of specific activities ask your provider.     Exposure to Children with illness: Try to avoid obvious exposure; report any symptoms to us  when noted,  If you have chicken pos, red measles or mumps, you should be immune to these diseases.   Please do not take any vaccines while pregnant unless you have checked with your OB provider.  Fetal Movement: After 28 weeks we recommend you do "kick counts" twice daily.  Lie or sit down in a calm quiet environment and count your baby movements "kicks".  You should feel your baby at least 10 times per hour.  If you have not felt 10 kicks within the first hour get up, walk around and have something sweet to eat or drink then repeat for an additional hour.  If count remains less than 10 per hour notify your provider.  Fumigating: Follow your pest control agent's advice as to how long to stay out of your home.  Ventilate the area well before re-entering.  Hemorrhoids:   Most over-the-counter preparations can be used during pregnancy.  Check your medication to see what is safe to use.  It is important to  use a stool softener or fiber in your diet and to drink lots of liquids.  If hemorrhoids seem to be getting worse please call the office.   Hot Tubs:  Hot tubs Jacuzzis and saunas are not recommended while pregnant.  These increase your internal body temperature and should be avoided.  Intercourse:  Sexual intercourse is safe during pregnancy as long as you are comfortable, unless otherwise advised by your provider.  Spotting may occur after intercourse; report any bright red bleeding that is heavier than spotting.  Labor:  If you know that you are in labor, please go to the hospital.  If you are unsure, please call the office and let us  help you decide what to do.  Lifting, straining, etc:  If your job requires heavy lifting or straining please check with your provider for any limitations.  Generally, you should not lift items heavier than that you can lift simply with your hands and arms (no back  muscles)  Painting:  Paint fumes do not harm your pregnancy, but may make you ill and should be avoided if possible.  Latex or water based paints have less odor than oils.  Use adequate ventilation while painting.  Permanents & Hair Color:  Chemicals in hair dyes are not recommended as they cause increase hair dryness which can increase hair loss during pregnancy.  " Highlighting" and permanents are allowed.  Dye may be absorbed differently and permanents may not hold as well during pregnancy.  Sunbathing:  Use a sunscreen, as skin burns easily during pregnancy.  Drink plenty of fluids; avoid over heating.  Tanning Beds:  Because their possible side effects are still unknown, tanning beds are not recommended.  Ultrasound Scans:  Routine ultrasounds are performed at approximately 20 weeks.  You will be able to see your baby's general anatomy an if you would like to know the gender this can usually be determined as well.  If it is questionable when you conceived you may also receive an ultrasound early in your pregnancy for dating purposes.  Otherwise ultrasound exams are not routinely performed unless there is a medical necessity.  Although you can request a scan we ask that you pay for it when conducted because insurance does not cover " patient request" scans.  Work: If your pregnancy proceeds without complications you may work until your due date, unless your physician or employer advises otherwise.  Round Ligament Pain/Pelvic Discomfort:  Sharp, shooting pains not associated with bleeding are fairly common, usually occurring in the second trimester of pregnancy.  They tend to be worse when standing up or when you remain standing for long periods of time.  These are the result of pressure of certain pelvic ligaments called "round ligaments".  Rest, Tylenol  and heat seem to be the most effective relief.  As the womb and fetus grow, they rise out of the pelvis and the discomfort improves.  Please  notify the office if your pain seems different than that described.  It may represent a more serious condition.

## 2023-05-27 NOTE — Telephone Encounter (Signed)
 Patient was seen in office today for pregnancy confirmation, she states that she was prescribed Bactrim  DS by Jinnie for UTI which she states that she did not start because she was concerned if safe to take during pregnancy. I assured patient this medication is safe to take while pregnant. Patient states that she saw urologist on 05/19/23 and was put on Macrobid  to take for the next 90 days. Patient states that she has not started macrobid  either and wants to know if medication is safe to take for that long of a period during pregnancy? Please advise. KW

## 2023-06-07 ENCOUNTER — Telehealth (INDEPENDENT_AMBULATORY_CARE_PROVIDER_SITE_OTHER): Payer: Medicaid Other

## 2023-06-07 DIAGNOSIS — Z3480 Encounter for supervision of other normal pregnancy, unspecified trimester: Secondary | ICD-10-CM

## 2023-06-07 DIAGNOSIS — Z3689 Encounter for other specified antenatal screening: Secondary | ICD-10-CM

## 2023-06-07 HISTORY — DX: Encounter for supervision of other normal pregnancy, unspecified trimester: Z34.80

## 2023-06-07 NOTE — Patient Instructions (Signed)
 First Trimester of Pregnancy  The first trimester of pregnancy starts on the first day of your last monthly period until the end of week 13. This is months 1 through 3 of pregnancy. A week after a sperm fertilizes an egg, the egg will implant into the wall of the uterus and begin to develop into a baby. Body changes during your first trimester Your body goes through many changes during pregnancy. The changes usually return to normal after your baby is born. Physical changes Your breasts may grow larger and may hurt. The area around your nipples may get darker. Your periods will stop. Your hair and nails may grow faster. You may pee more often. Health changes You may tire easily. Your gums may bleed and may be sensitive when you brush and floss. You may not feel hungry. You may have heartburn. You may throw up or feel like you may throw up. You may want to eat some foods, but not others. You may have headaches. You may have trouble pooping (constipation). Other changes Your emotions may change from day to day. You may have more dreams. Follow these instructions at home: Medicines Talk to your health care provider if you're taking medicines. Ask if the medicines are safe to take during pregnancy. Your provider may change the medicines that you take. Do not take any medicines unless told to by your provider. Take a prenatal vitamin that has at least 600 micrograms (mcg) of folic acid. Do not use herbal medicines, illegal substances, or medicines that are not approved by your provider. Eating and drinking While you're pregnant your body needs extra food for your growing baby. Talk with your provider about what to eat while pregnant. Activity Most women are able to exercise during pregnancy. Exercises may need to change as your pregnancy goes on. Talk to your provider about your activities and exercise routines. Relieving pain and discomfort Wear a good, supportive bra if your breasts  hurt. Rest with your legs raised if you have leg cramps or low back pain. Safety Wear your seatbelt at all times when you're in a car. Talk to your provider if someone hits you, hurts you, or yells at you. Talk with your provider if you're feeling sad or have thoughts of hurting yourself. Lifestyle Certain things can be harmful while you're pregnant. Follow these rules: Do not use hot tubs, steam rooms, or saunas. Do not douche. Do not use tampons or scented pads. Do not drink alcohol,smoke, vape, or use products with nicotine or tobacco in them. If you need help quitting, talk with your provider. Avoid cat litter boxes and soil used by cats. These things carry germs that can cause harm to your pregnancy and your baby. General instructions Keep all follow-up visits. It helps you and your unborn baby stay as healthy as possible. Write down your questions. Take them to your visits. Your provider will: Talk with you about your overall health. Give you advice or refer you to specialists who can help with different needs, including: Prenatal education classes. Mental health and counseling. Foods and healthy eating. Ask for help if you need help with food. Call your dentist and ask to be seen. Brush your teeth with a soft toothbrush. Floss gently. Where to find more information American Pregnancy Association: americanpregnancy.org [employer]: acog.org Office on [employer] Health: [employer] Contact a health care provider if: You feel dizzy, faint, or have a fever. You vomit or have watery poop (diarrhea) for 2  days or more. You have abnormal discharge or bleeding from your vagina. You have pain when you pee or your pee smells bad. You have cramps, pain, or pressure in your belly area. Get help right away if: You have trouble breathing or chest pain. You have any kind of injury, such as from a fall or a car crash. These symptoms may be an  emergency. Get help right away. Call 911. Do not wait to see if the symptoms will go away. Do not drive yourself to the hospital. This information is not intended to replace advice given to you by your health care provider. Make sure you discuss any questions you have with your health care provider. Document Revised: 01/07/2023 Document Reviewed: 08/07/2022 Elsevier Patient Education  2024 Elsevier Inc.Common Medications Safe in Pregnancy  Acne:      Constipation:  Benzoyl Peroxide     Colace  Clindamycin      Dulcolax Suppository  Topica Erythromycin     Fibercon  Salicylic Acid      Metamucil         Miralax AVOID:        Senakot   Accutane    Cough:  Retin-A       Cough Drops  Tetracycline      Phenergan w/ Codeine if Rx  Minocycline      Robitussin (Plain & DM)  Antibiotics:     Crabs/Lice:  Ceclor       RID  Cephalosporins    AVOID:  E-Mycins      Kwell  Keflex  Macrobid/Macrodantin   Diarrhea:  Penicillin      Kao-Pectate  Zithromax      Imodium AD         PUSH FLUIDS AVOID:       Cipro     Fever:  Tetracycline      Tylenol (Regular or Extra  Minocycline       Strength)  Levaquin      Extra Strength-Do not          Exceed 8 tabs/24 hrs Caffeine:        200mg /day (equiv. To 1 cup of coffee or  approx. 3 12 oz sodas)         Gas: Cold/Hayfever:       Gas-X  Benadryl      Mylicon  Claritin       Phazyme  **Claritin-D        Chlor-Trimeton    Headaches:  Dimetapp      ASA-Free Excedrin  Drixoral-Non-Drowsy     Cold Compress  Mucinex (Guaifenasin)     Tylenol (Regular or Extra  Sudafed/Sudafed-12 Hour     Strength)  **Sudafed PE Pseudoephedrine   Tylenol Cold & Sinus     Vicks Vapor Rub  Zyrtec  **AVOID if Problems With Blood Pressure         Heartburn: Avoid lying down for at least 1 hour after meals  Aciphex      Maalox     Rash:  Milk of Magnesia     Benadryl    Mylanta       1% Hydrocortisone Cream  Pepcid  Pepcid Complete   Sleep  Aids:  Prevacid      Ambien   Prilosec       Benadryl  Rolaids       Chamomile Tea  Tums (Limit 4/day)     Unisom         Tylenol PM  Warm milk-add vanilla or  Hemorrhoids:       Sugar for taste  Anusol/Anusol H.C.  (RX: Analapram 2.5%)  Sugar Substitutes:  Hydrocortisone OTC     Ok in moderation  Preparation H      Tucks        Vaseline lotion applied to tissue with wiping    Herpes:     Throat:  Acyclovir      Oragel  Famvir  Valtrex     Vaccines:         Flu Shot Leg Cramps:       *Gardasil  Benadryl      Hepatitis A         Hepatitis B Nasal Spray:       Pneumovax  Saline Nasal Spray     Polio Booster         Tetanus Nausea:       Tuberculosis test or PPD  Vitamin B6 25 mg TID   AVOID:    Dramamine      *Gardasil  Emetrol       Live Poliovirus  Ginger Root 250 mg QID    MMR (measles, mumps &  High Complex Carbs @ Bedtime    rebella)  Sea Bands-Accupressure    Varicella (Chickenpox)  Unisom 1/2 tab TID     *No known complications           If received before Pain:         Known pregnancy;   Darvocet       Resume series after  Lortab        Delivery  Percocet    Yeast:   Tramadol      Femstat  Tylenol 3      Gyne-lotrimin  Ultram       Monistat  Vicodin           MISC:         All Sunscreens           Hair Coloring/highlights          Insect Repellant's          (Including DEET)         Mystic Tans

## 2023-06-07 NOTE — Progress Notes (Signed)
 New OB Intake  I connected with  Madison Lee on 06/07/23 at  1:15 PM EST by  Video Visit and verified that I am speaking with the correct person using two identifiers. Nurse is located at [facility] and pt is located at home.  I explained I am completing New OB Intake today. We discussed her EDD of 01/23/24 that is based on LMP of 04/17/24. Pt is G3/P2. I reviewed her allergies, medications, Medical/Surgical/OB history, and appropriate screenings. There are not cats in the home. Based on history, this is a/an pregnancy uncomplicated . Her obstetrical history is significant for  none .  Patient Active Problem List   Diagnosis Date Noted   Acute non-recurrent pansinusitis 10/12/2021   SVD (spontaneous vaginal delivery)     Concerns addressed today: She was put on Macrobid for 90 days. Is this safe to take continuous for 90 days while pregnant?  Delivery Plans:  Plans to deliver at [facility]  Anatomy US Explained first scheduled [place] will be on 06/29/23. Anatomy US scheduled for [redacted] weeks gestational age.   Labs Discussed genetic screening with patient. Patient will like  genetic testing to be drawn at new OB visit. Discussed possible labs to be drawn at new OB appointment.  COVID Vaccine Patient has not had COVID vaccine.   Social Determinants of Health Food Insecurity: denies food insecurity WIC Referral: Patient is not interested in referral to [facility].  Transportation: Patient denies transportation needs. Childcare: Discussed no children allowed at ultrasound appointments.   First visit review I reviewed new OB appt with pt. I explained she will have blood work and pap smear/pelvic exam if indicated. Explained pt will be seen by Guadlupe Spanish at first visit; encounter routed to appropriate provider.   Cornelius Moras, CMA 06/07/2023  11:01 AM

## 2023-06-28 ENCOUNTER — Other Ambulatory Visit: Payer: Self-pay | Admitting: Obstetrics

## 2023-06-28 DIAGNOSIS — O3680X0 Pregnancy with inconclusive fetal viability, not applicable or unspecified: Secondary | ICD-10-CM

## 2023-06-29 ENCOUNTER — Ambulatory Visit (INDEPENDENT_AMBULATORY_CARE_PROVIDER_SITE_OTHER): Payer: Medicaid Other

## 2023-06-29 DIAGNOSIS — O3680X0 Pregnancy with inconclusive fetal viability, not applicable or unspecified: Secondary | ICD-10-CM | POA: Diagnosis not present

## 2023-06-29 DIAGNOSIS — Z3A08 8 weeks gestation of pregnancy: Secondary | ICD-10-CM | POA: Diagnosis not present

## 2023-07-12 ENCOUNTER — Encounter: Payer: Medicaid Other | Admitting: Obstetrics

## 2023-07-19 ENCOUNTER — Encounter: Payer: Self-pay | Admitting: Obstetrics

## 2023-07-19 ENCOUNTER — Other Ambulatory Visit: Payer: Self-pay | Admitting: Obstetrics

## 2023-07-19 NOTE — Progress Notes (Signed)
 EDD changed to 02/05/24 based on [place].  Madison Lee, CNM

## 2023-07-22 ENCOUNTER — Ambulatory Visit

## 2023-07-22 VITALS — BP 134/93 | HR 93 | Wt 143.6 lb

## 2023-07-22 DIAGNOSIS — R399 Unspecified symptoms and signs involving the genitourinary system: Secondary | ICD-10-CM | POA: Diagnosis not present

## 2023-07-22 LAB — POCT URINALYSIS DIPSTICK
Bilirubin, UA: NEGATIVE
Blood, UA: NEGATIVE
Glucose, UA: NEGATIVE
Ketones, UA: NEGATIVE
Leukocytes, UA: NEGATIVE
Nitrite, UA: NEGATIVE
Protein, UA: NEGATIVE
Spec Grav, UA: 1.01 (ref 1.010–1.025)
Urobilinogen, UA: 0.2 U/dL
pH, UA: 6.5 (ref 5.0–8.0)

## 2023-07-22 NOTE — Progress Notes (Addendum)
    NURSE VISIT NOTE  Subjective:    Patient ID: Madison Lee, female    DOB: [birth year], 28 y.o.   MRN: [id number]       HPI  Patient is a 28 y.o. [id number] female who presents for  UTI Symptoms, lower back pain, kidney pressure while standing  for 2 days.  Patient denies  hematuria .  Patient does not have a history of recurrent UTI.  Patient does not have a history of pyelonephritis.    Objective:    BP (!) 134/93 (BP Location: Left Arm, Patient Position: Sitting, Cuff Size: Normal)   Pulse 93   Wt 143 lb 9.6 oz (65.1 kg)   LMP 04/18/2023 (Exact Date)   BMI 24.65 kg/m    Lab Review  Results for orders placed or performed in visit on 07/22/23  Urine Culture   Specimen: Urine   UR  Result Value Ref Range   Urine Culture, Routine Final report    Organism ID, Bacteria No growth   POCT Urinalysis Dipstick  Result Value Ref Range   Color, UA     Clarity, UA     Glucose, UA Negative Negative   Bilirubin, UA Negative    Ketones, UA Negative    Spec Grav, UA 1.010 1.010 - 1.025   Blood, UA Negative    pH, UA 6.5 5.0 - 8.0   Protein, UA Negative Negative   Urobilinogen, UA 0.2 0.2 or 1.0 E.U./dL   Nitrite, UA Negative    Leukocytes, UA Negative Negative   Appearance     Odor      Assessment:   1. UTI symptoms      Plan:   Urine Culture Sent.   Burtis Junes, CMA

## 2023-07-24 ENCOUNTER — Encounter: Payer: Self-pay | Admitting: Obstetrics and Gynecology

## 2023-07-24 LAB — URINE CULTURE: Organism ID, Bacteria: NO GROWTH

## 2023-07-26 ENCOUNTER — Ambulatory Visit (INDEPENDENT_AMBULATORY_CARE_PROVIDER_SITE_OTHER): Admitting: Licensed Practical Nurse

## 2023-07-26 ENCOUNTER — Encounter: Payer: Self-pay | Admitting: Licensed Practical Nurse

## 2023-07-26 ENCOUNTER — Other Ambulatory Visit (HOSPITAL_COMMUNITY)
Admission: RE | Admit: 2023-07-26 | Discharge: 2023-07-26 | Disposition: A | Discharge status: Home / Self Care | Source: Ambulatory Visit | Attending: Licensed Practical Nurse | Admitting: Licensed Practical Nurse

## 2023-07-26 VITALS — BP 115/82 | HR 80 | Wt 143.2 lb

## 2023-07-26 DIAGNOSIS — Z3481 Encounter for supervision of other normal pregnancy, first trimester: Secondary | ICD-10-CM

## 2023-07-26 DIAGNOSIS — Z113 Encounter for screening for infections with a predominantly sexual mode of transmission: Secondary | ICD-10-CM | POA: Diagnosis not present

## 2023-07-26 DIAGNOSIS — Z3A12 12 weeks gestation of pregnancy: Secondary | ICD-10-CM

## 2023-07-26 DIAGNOSIS — Z3480 Encounter for supervision of other normal pregnancy, unspecified trimester: Secondary | ICD-10-CM | POA: Insufficient documentation

## 2023-07-26 LAB — POCT URINALYSIS DIPSTICK
Bilirubin, UA: NEGATIVE
Blood, UA: NEGATIVE
Glucose, UA: NEGATIVE
Ketones, UA: NEGATIVE
Leukocytes, UA: NEGATIVE
Nitrite, UA: NEGATIVE
Protein, UA: NEGATIVE
Spec Grav, UA: 1.02 (ref 1.010–1.025)
Urobilinogen, UA: 0.2 U/dL
pH, UA: 7.5 (ref 5.0–8.0)

## 2023-07-26 LAB — OB RESULTS CONSOLE GBS: GBS: POSITIVE

## 2023-07-27 ENCOUNTER — Encounter: Payer: Self-pay | Admitting: Licensed Practical Nurse

## 2023-07-27 ENCOUNTER — Telehealth: Payer: Self-pay

## 2023-07-27 LAB — MICROSCOPIC EXAMINATION
Bacteria, UA: NONE SEEN
Casts: NONE SEEN /LPF

## 2023-07-27 LAB — URINALYSIS, ROUTINE W REFLEX MICROSCOPIC
Bilirubin, UA: NEGATIVE
Glucose, UA: NEGATIVE
Ketones, UA: NEGATIVE
Nitrite, UA: NEGATIVE
Protein,UA: NEGATIVE
RBC, UA: NEGATIVE
Specific Gravity, UA: 1.015 (ref 1.005–1.030)
Urobilinogen, Ur: 0.2 mg/dL (ref 0.2–1.0)
pH, UA: 6 (ref 5.0–7.5)

## 2023-07-27 LAB — CERVICOVAGINAL ANCILLARY ONLY
Bacterial Vaginitis (gardnerella): NEGATIVE
Candida Glabrata: NEGATIVE
Candida Vaginitis: NEGATIVE
Chlamydia: NEGATIVE
Comment: NEGATIVE
Comment: NEGATIVE
Comment: NEGATIVE
Comment: NEGATIVE
Comment: NEGATIVE
Comment: NORMAL
Neisseria Gonorrhea: NEGATIVE
Trichomonas: NEGATIVE

## 2023-07-27 NOTE — Progress Notes (Signed)
 NEW OB HISTORY AND PHYSICAL  SUBJECTIVE:       Madison Lee is a 28 y.o. G21P2002 female, Patient's last menstrual period was 04/18/2023 (exact date)., Estimated Date of Delivery: 02/05/24, [redacted]w[redacted]d, presents today for establishment of Prenatal Care. She reports nausea , backache, and fatigue  Madison Lee was switching methods of birth control (ring to pills) when she conceived. She is happy to be pregnant. Madison Lee previously had an elective induction at 39 weeks. This experience was not pleasant and the epidural did not take.  She now would prefer to await spontaneous labor and have an unmedicated birth.   Hx of Depression/Anxiety: anxiety seems to be worse lately  Hx of Asthma: only in cold weather, used an inhaler when she was in [employer]   Social history Partner/Relationship: married, she and her partner have had difficulties in the last year including infidelity, they are working through this.  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  Substance use: denies tobacco, alcohol or illicit drug use   Indications for ASA therapy (per uptodate) One of the following: Previous pregnancy with preeclampsia, especially early onset and with an adverse outcome No Multifetal gestation No Chronic hypertension No Type 1 or 2 diabetes mellitus No Chronic kidney disease No Autoimmune disease (antiphospholipid syndrome, systemic lupus erythematosus) No  Two or more of the following: Nulliparity No Obesity (body mass index >30 kg/m2) No Family history of preeclampsia in mother or sister No Age >=35 years No Sociodemographic characteristics (African American race, low socioeconomic level) No Personal risk factors (eg, previous pregnancy with low birth weight or small for gestational age infant, previous adverse pregnancy outcome [eg, stillbirth], interval >10 years between pregnancies) No   Gynecologic  History Patient's last menstrual period was 04/18/2023 (exact date).  Contraception:  was using combined est/progest  Last Pap: 06/24/2022. Results were: ASC-US HPV negative   Obstetric History OB History  Gravida Para Term Preterm AB Living  3 2 2   2   SAB IAB Ectopic Multiple Live Births     0 2    # Outcome Date GA Lbr Len/2nd Weight Sex Type Anes PTL Lv  3 Current           2 Term 05/04/21 [redacted]w[redacted]d 05:10 7 lb 6.9 oz (3.37 kg) M Vag-Spont EPI  LIV  1 Term 05/19/18 [redacted]w[redacted]d / 00:37 8 lb 10.6 oz (3.93 kg) F Vag-Spont EPI  LIV    Past Medical History:  Diagnosis Date   Asthma     Past Surgical History:  Procedure Laterality Date   NO PAST SURGERIES      Current Outpatient Medications on File Prior to Visit  Medication Sig Dispense Refill   etonogestrel-ethinyl estradiol (NUVARING) 0.12-0.015 MG/24HR vaginal ring Insert vaginally and leave in place for 3 consecutive weeks, then remove for 1 week. (Patient not taking: Reported on 06/07/2023) 1 each 12   nitrofurantoin, macrocrystal-monohydrate, (MACROBID) 100 MG capsule Take 1 capsule (100 mg total) by mouth daily. 90 capsule 0   Prenatal Vit-Fe Fumarate-FA (MULTIVITAMIN-PRENATAL) 27-0.8 MG TABS tablet Take 1 tablet by mouth daily at 12 noon.     ibuprofen (ADVIL) 600 MG tablet Take 1 tablet (600 mg total) by mouth every 6 (six) hours. (Patient not taking: Reported on 06/07/2023) 30 tablet 0   sulfamethoxazole-trimethoprim (BACTRIM DS) 800-160 MG tablet Take 1 tablet by mouth 2 (two) times daily. (Patient not taking: Reported on 06/07/2023) 14  tablet 1   No current facility-administered medications on file prior to visit.    Allergies  Allergen Reactions   Amoxicillin Hives    Social History   Socioeconomic History   Marital status: Married    Spouse name: Tymber Stallings   Number of children: 2   Years of education: Not on file   Highest education level: Not on file  Occupational History   Not on file  Tobacco Use   Smoking  status: Never   Smokeless tobacco: Never  Vaping Use   Vaping status: Never Used  Substance and Sexual Activity   Alcohol use: Not Currently    Comment: social   Drug use: Never   Sexual activity: Yes    Birth control/protection: None  Other Topics Concern   Not on file  Social History Narrative   Not on file   Social Drivers of Health   Financial Resource Strain: Low Risk  (06/07/2023)   Overall Financial Resource Strain (CARDIA)    Difficulty of Paying Living Expenses: Not hard at all  Food Insecurity: No Food Insecurity (06/07/2023)   Hunger Vital Sign    Worried About Running Out of Food in the Last Year: Never true    Ran Out of Food in the Last Year: Never true  Transportation Needs: No Transportation Needs (06/07/2023)   PRAPARE - Administrator, Civil Service (Medical): No    Lack of Transportation (Non-Medical): No  Physical Activity: Insufficiently Active (06/07/2023)   Exercise Vital Sign    Days of Exercise per Week: 1 day    Minutes of Exercise per Session: 30 min  Stress: No Stress Concern Present (06/07/2023)   [employer] of Occupational Health - Occupational Stress Questionnaire    Feeling of Stress : Only a little  Social Connections: Socially Integrated (06/07/2023)   Social Connection and Isolation Panel [NHANES]    Frequency of Communication with Friends and Family: More than three times a week    Frequency of Social Gatherings with Friends and Family: More than three times a week    Attends Religious Services: More than 4 times per year    Active Member of [employer] or Organizations: Yes    Attends Engineer, structural: More than 4 times per year    Marital Status: Married  Catering manager Violence: Not At Risk (06/07/2023)   Humiliation, Afraid, Rape, and Kick questionnaire    Fear of Current or Ex-Partner: No    Emotionally Abused: No    Physically Abused: No    Sexually Abused: No    Family History  Problem Relation Age of  Onset   Hyperthyroidism Mother    Cancer Father        Skin   Colon cancer Paternal Aunt 61   Heart disease Paternal Grandfather    Bone cancer Other    Breast cancer Other        94s   Prostate cancer Other     The following portions of the patient's history were reviewed and updated as appropriate: allergies, current medications, past OB history, past medical history, past surgical history, past family history, past social history, and problem list.  Constitutional: Denied constitutional symptoms, night sweats, recent illness, fever, insomnia and weight loss. Yes fatigue  Eyes: Denied eye symptoms, eye pain, photophobia, vision change and visual disturbance.  Ears/Nose/Throat/Neck: Denied ear, nose, throat or neck symptoms, hearing loss, nasal discharge, sinus congestion and sore throat.  Cardiovascular: Denied cardiovascular symptoms, arrhythmia, chest  pain/pressure, edema, exercise intolerance, orthopnea and palpitations.  Respiratory: Denied pulmonary symptoms, asthma, pleuritic pain, productive sputum, cough, dyspnea and wheezing.  Gastrointestinal: Denied gastro-esophageal reflux, melena, and vomiting. Yes nausea   Genitourinary: Denied genitourinary symptoms including symptomatic vaginal discharge, pelvic relaxation issues, and urinary complaints.  Musculoskeletal: Denied musculoskeletal symptoms, stiffness, swelling, muscle weakness and myalgia. Yes low back pain   Dermatologic: Denied dermatology symptoms, rash and scar.  Neurologic: Denied neurology symptoms, dizziness, headache, neck pain and syncope.  Psychiatric: Denied psychiatric symptoms, anxiety and depression.  Endocrine: Denied endocrine symptoms including hot flashes and night sweats.     OBJECTIVE: Initial Physical Exam (New OB)  GENERAL APPEARANCE: alert, well appearing, in no apparent distress HEAD: normocephalic, atraumatic MOUTH: mucous membranes moist, pharynx normal without lesions THYROID: no  thyromegaly or masses present BREASTS: no masses noted, no significant tenderness, no palpable axillary nodes, no skin changes LUNGS: clear to auscultation, no wheezes, rales or rhonchi, symmetric air entry HEART: regular rate and rhythm, no murmurs ABDOMEN: Gravid, soft, nontender, nondistended, no abnormal masses, no epigastric pain EXTREMITIES: no redness or tenderness in the calves or thighs SKIN: normal coloration and turgor, no rashes LYMPH NODES: no adenopathy palpable NEUROLOGIC: alert, oriented, normal speech, no focal findings or movement disorder noted  PELVIC EXAM exam deferred   ASSESSMENT: Normal pregnancy   PLAN: Routine prenatal care. We discussed an overview of prenatal care and when to call. Reviewed diet, exercise, and weight gain recommendations in pregnancy. Discussed benefits of breastfeeding and lactation resources at [place]. I reviewed labs and answered all questions.  1. Supervision of other normal pregnancy, antepartum (Primary) - NOB Panel - Culture, OB Urine - Monitor Drug Profile 14(MW) - Nicotine screen, urine - Urinalysis, Routine w reflex microscopic - Hgb Fractionation Cascade - Protein / creatinine ratio, urine - Cervicovaginal ancillary only - MaterniT21 PLUS Core - POCT Urinalysis Dipstick  2. [redacted] weeks gestation of pregnancy - NOB Panel - Culture, OB Urine - Monitor Drug Profile 14(MW) - Nicotine screen, urine - Urinalysis, Routine w reflex microscopic - Hgb Fractionation Cascade - Protein / creatinine ratio, urine - Cervicovaginal ancillary only - MaterniT21 PLUS Core - POCT Urinalysis Dipstick  3. Screening examination for venereal disease - NOB Panel - Protein / creatinine ratio, urine   Alexsandro Salek M Kaitlyne Friedhoff, CNM

## 2023-07-28 LAB — CBC/D/PLT+RPR+RH+ABO+RUBIGG...
Antibody Screen: NEGATIVE
Basophils Absolute: 0 10*3/uL (ref 0.0–0.2)
Basos: 0 %
EOS (ABSOLUTE): 0 10*3/uL (ref 0.0–0.4)
Eos: 1 %
HCV Ab: NONREACTIVE
HIV Screen 4th Generation wRfx: NONREACTIVE
Hematocrit: 38.9 % (ref 34.0–46.6)
Hemoglobin: 13.2 g/dL (ref 11.1–15.9)
Hepatitis B Surface Ag: NEGATIVE
Immature Grans (Abs): 0 10*3/uL (ref 0.0–0.1)
Immature Granulocytes: 1 %
Lymphocytes Absolute: 1.6 10*3/uL (ref 0.7–3.1)
Lymphs: 25 %
MCH: 30 pg (ref 26.6–33.0)
MCHC: 33.9 g/dL (ref 31.5–35.7)
MCV: 88 fL (ref 79–97)
Monocytes Absolute: 0.5 10*3/uL (ref 0.1–0.9)
Monocytes: 8 %
Neutrophils Absolute: 4.2 10*3/uL (ref 1.4–7.0)
Neutrophils: 65 %
Platelets: 212 10*3/uL (ref 150–450)
RBC: 4.4 x10E6/uL (ref 3.77–5.28)
RDW: 12.8 % (ref 11.7–15.4)
RPR Ser Ql: NONREACTIVE
Rh Factor: POSITIVE
Rubella Antibodies, IGG: 2.54 {index} (ref >0.99)
Varicella zoster IgG: REACTIVE
WBC: 6.4 10*3/uL (ref 3.4–10.8)

## 2023-07-28 LAB — MONITOR DRUG PROFILE 14(MW)
Amphetamine Scrn, Ur: NEGATIVE ng/mL
BARBITURATE SCREEN URINE: NEGATIVE ng/mL
BENZODIAZEPINE SCREEN, URINE: NEGATIVE ng/mL
Buprenorphine, Urine: NEGATIVE ng/mL
CANNABINOIDS UR QL SCN: NEGATIVE ng/mL
Cocaine (Metab) Scrn, Ur: NEGATIVE ng/mL
Creatinine(Crt), U: 66.8 mg/dL (ref 20.0–300.0)
Fentanyl, Urine: NEGATIVE pg/mL
Meperidine Screen, Urine: NEGATIVE ng/mL
Methadone Screen, Urine: NEGATIVE ng/mL
OXYCODONE+OXYMORPHONE UR QL SCN: NEGATIVE ng/mL
Opiate Scrn, Ur: NEGATIVE ng/mL
Ph of Urine: 5.6 (ref 4.5–8.9)
Phencyclidine Qn, Ur: NEGATIVE ng/mL
Propoxyphene Scrn, Ur: NEGATIVE ng/mL
SPECIFIC GRAVITY: 1.015
Tramadol Screen, Urine: NEGATIVE ng/mL

## 2023-07-28 LAB — HGB FRACTIONATION CASCADE
Hgb A2: 2.8 % (ref 1.8–3.2)
Hgb A: 97.2 % (ref 96.4–98.8)
Hgb F: 0 % (ref 0.0–2.0)
Hgb S: 0 %

## 2023-07-28 LAB — PROTEIN / CREATININE RATIO, URINE
Creatinine, Urine: 67.7 mg/dL
Protein, Ur: 9.6 mg/dL
Protein/Creat Ratio: 142 mg/g{creat} (ref 0–200)

## 2023-07-28 LAB — NICOTINE SCREEN, URINE: Cotinine Ql Scrn, Ur: NEGATIVE ng/mL

## 2023-07-28 LAB — HCV INTERPRETATION

## 2023-07-28 IMAGING — US US OB COMP +14 WK
1 series · 15 of 28 positions shown · non-contrast
Comparison: none

CLINICAL DATA: 25-year-old pregnant female presents for fetal
anatomic survey and fetal dating.

EXAM:
OBSTETRICAL ULTRASOUND >14 WKS

[Series 1: us ob comp + 14 wk · 91 acquisitions, 15 frames shown]
[im 1/91]
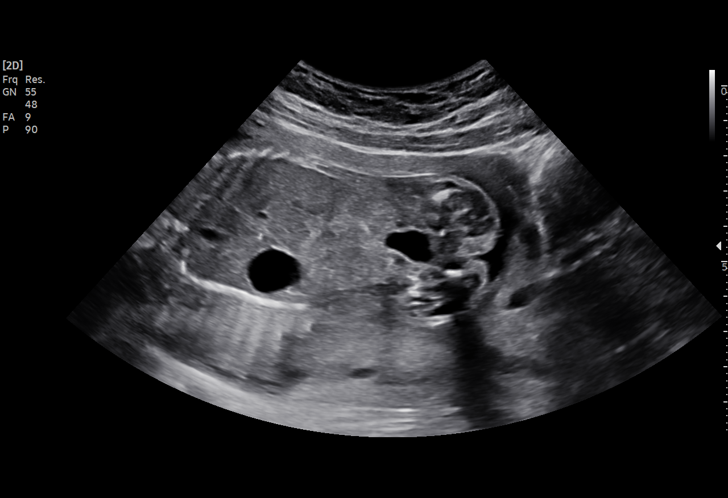
[im 7/91]
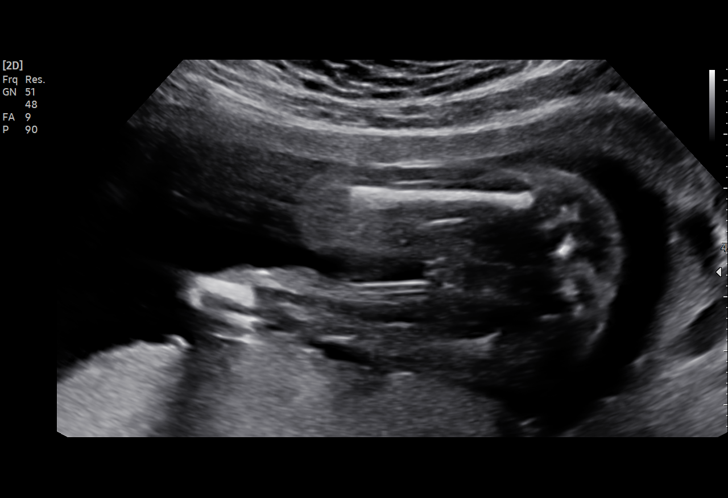
[im 14/91]
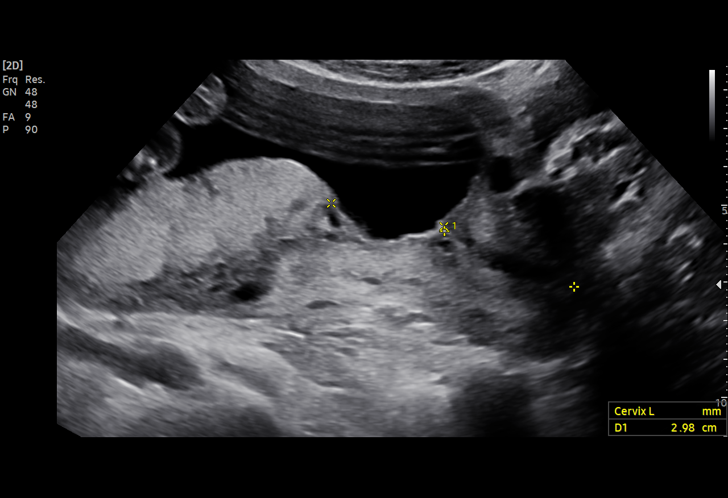
[im 21/91]
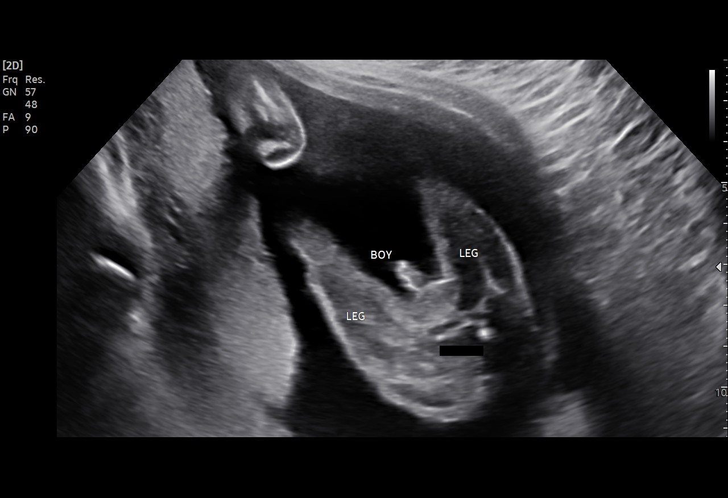
[im 27/91]
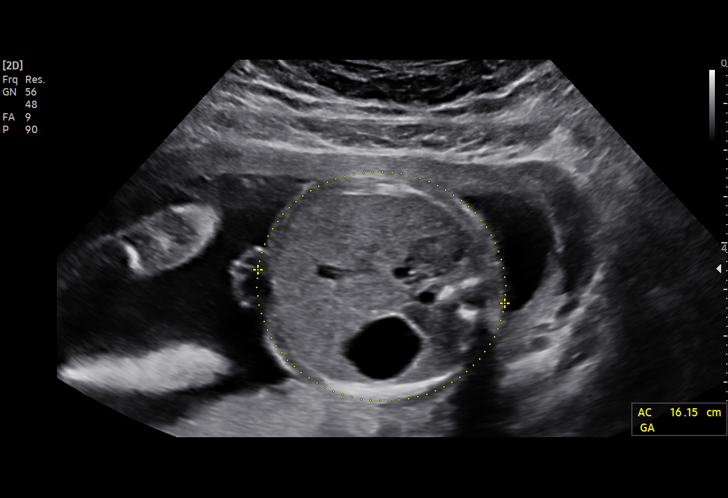
[im 34/91]
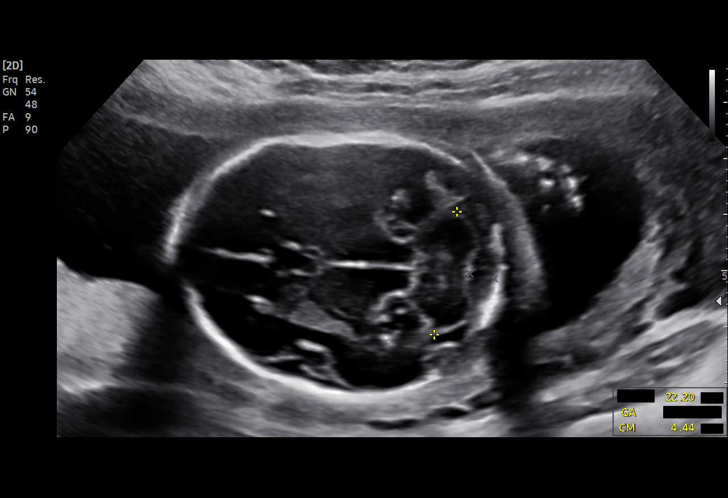
[im 41/91]
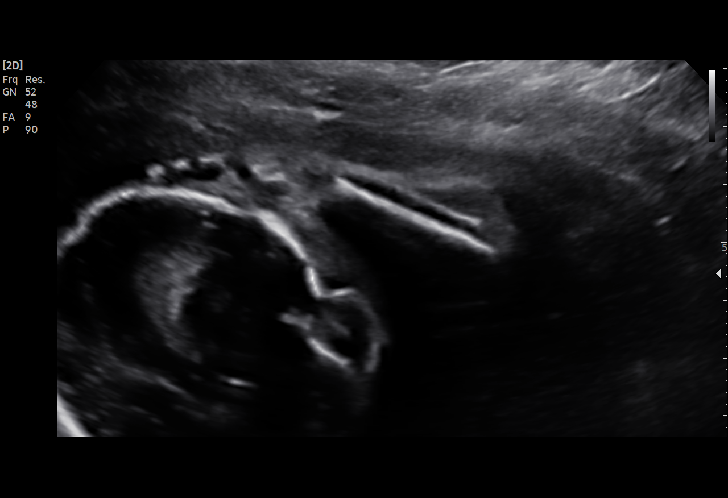
[im 47/91]
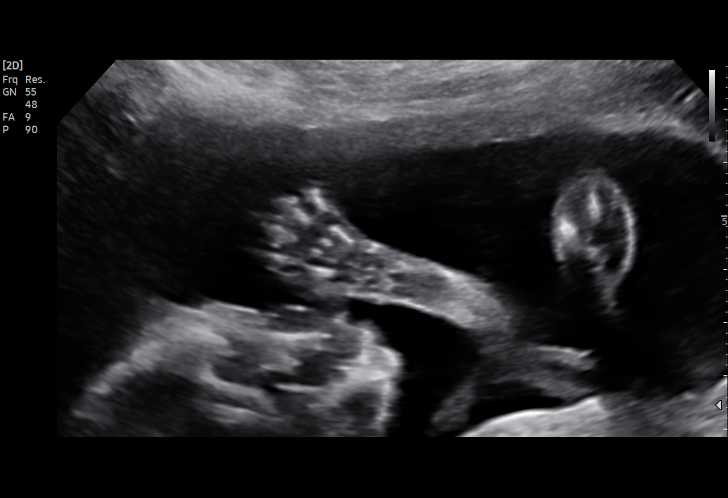
[im 51/91]
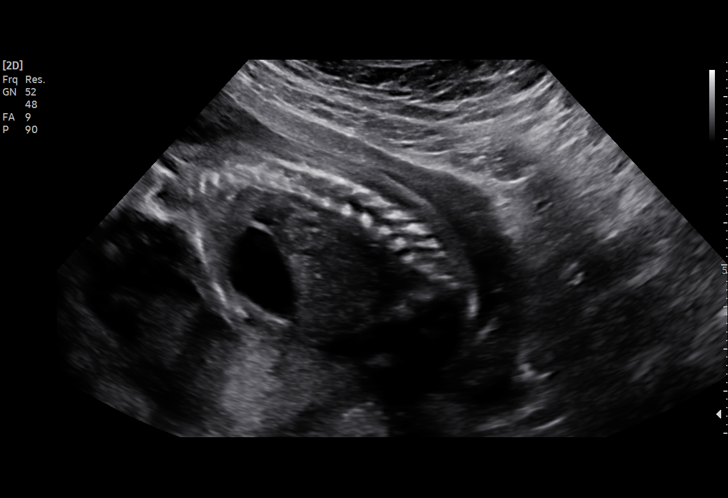
[im 57/91]
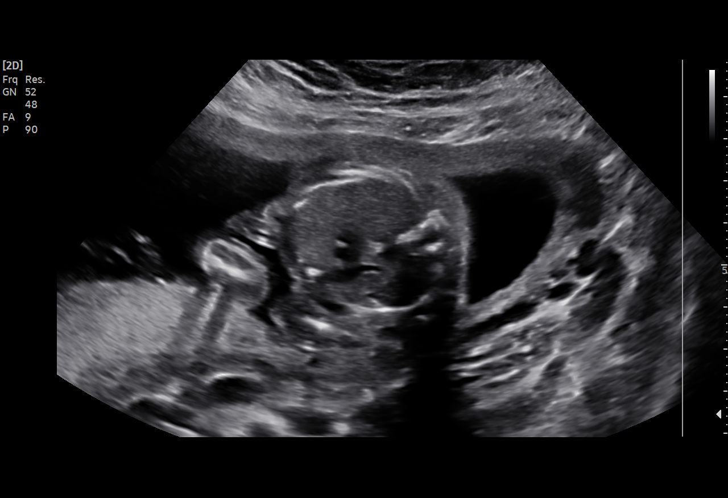
[im 64/91]
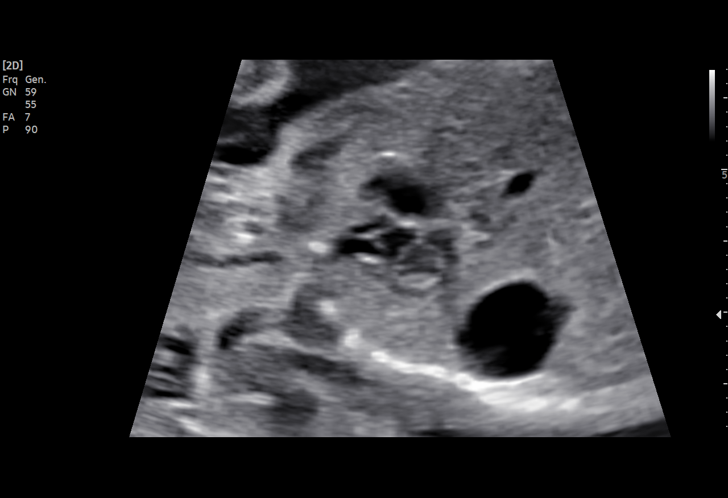
[im 71/91]
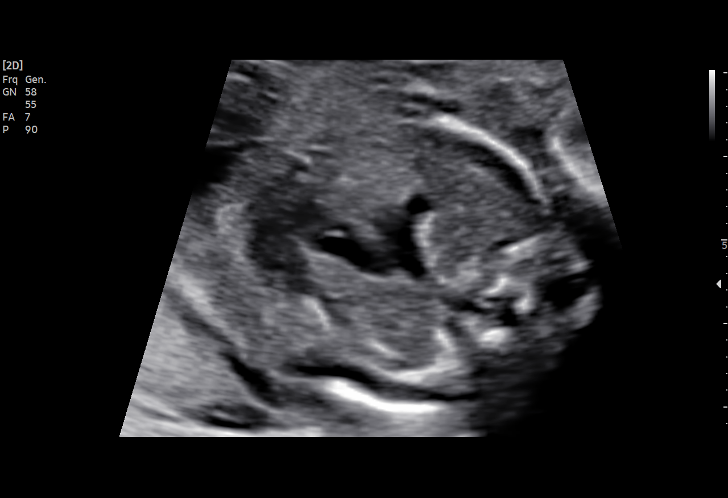
[im 77/91]
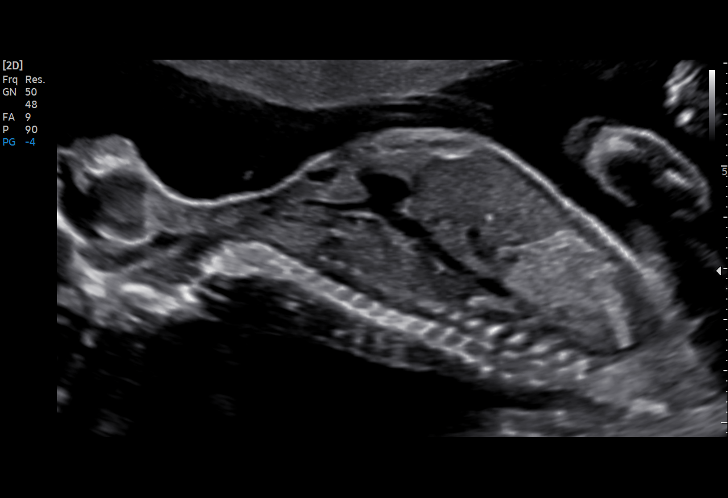
[im 84/91]
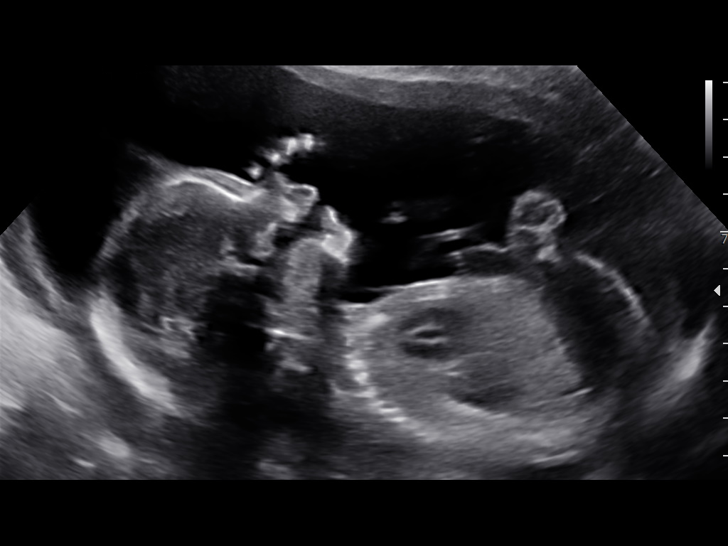
[im 91/91]
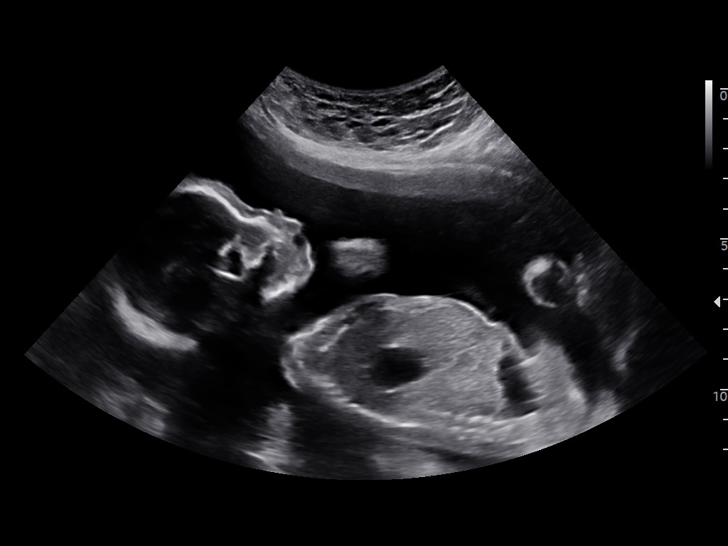

[15 of 28 positions shown; findings below may reference images not displayed]

FINDINGS: Number of Fetuses: 1

Heart Rate:  131 bpm

Movement: Yes

Presentation: Breech

Previa: No

Placental Location: Posterior

Amniotic Fluid (Subjective): Normal

Amniotic Fluid (Objective):

Vertical pocket = 4.8cm

FETAL BIOMETRY

BPD: 4.8cm 20w 4d

HC:   18.1cm 20w 3d

AC:   16.1cm 21w 1d

FL:   3.5cm 20w 6d

Current Mean GA: 20w 6d US EDC: 05/10/2021

Assigned GA:  21w 1d Assigned EDC: 05/08/2021

Estimated Fetal Weight:  389g

FETAL ANATOMY

Lateral Ventricles: Appears normal

Thalami/CSP: Appears normal

Posterior Fossa:  Appears normal

Nuchal Region: Appears normal   NFT= N/A > 20 WKS

Upper Lip: Appears normal

Spine: Appears normal

4 Chamber Heart on Left: Appears normal

LVOT: Appears normal

RVOT: Appears normal

Stomach on Left: Appears normal

3 Vessel Cord: Appears normal

Cord Insertion site: Not visualized

Kidneys: Not visualized

Bladder: Appears normal

Extremities: Appears normal, 4 extremities demonstrated

Sex: Male external genitalia

Maternal Findings:

Cervix: Cervix length approximately 3.7 cm on transabdominal views,
with no evidence of internal cervical funneling.
IMPRESSION: 1. Single living intrauterine gestation in breech lie at 20 weeks 6
days by average ultrasound age, concordant with assigned dating.
2. Incomplete fetal anatomic survey with attention to the kidney and
MAGGA LILJA views. No fetal or maternal abnormalities detected. Suggest
follow-up obstetric scan in 4 weeks for completion of fetal anatomic
survey.

## 2023-07-30 ENCOUNTER — Other Ambulatory Visit: Payer: Self-pay | Admitting: Licensed Practical Nurse

## 2023-07-30 LAB — URINE CULTURE, OB REFLEX

## 2023-07-30 LAB — CULTURE, OB URINE

## 2023-07-30 NOTE — Telephone Encounter (Signed)
 Patient calling to discuss UC results GBS+. She has questions about this. She is currently on macrobid and has had multiple recurrent UTI's since January. She is concerned that she acquired these from her husband who was out of the home for several months and returned in January. Advised GBS is not sexually transmitted, will send GBS info via my chart for her to review. Also sending message to [facility] for review of plan of care.

## 2023-07-30 NOTE — Progress Notes (Signed)
 TC to pt, pt called earlier regarding urien culture GBS was found in the urine, according to recommendations we should not treat GBS until >100,000 colony count. You test was between 50-100,000. Also you are allergic to Amoxicillin. Ideally we would know what this GSB is susceptible too. Pt reports, except for low back pain,  she did not have any UTI sxs at the time of her visit. She is on macrobid for frequent UTI's. She plans to follow up with her Urologist regarding treatment. Pt received info from [facility] GBS recommendations. All questions answered Jannifer Hick  [facility]  07/30/23  6:20 PM

## 2023-07-31 LAB — MATERNIT 21 PLUS CORE, BLOOD
Fetal Fraction: 20
Result (T21): NEGATIVE
Trisomy 13 (Patau syndrome): NEGATIVE
Trisomy 18 (Edwards syndrome): NEGATIVE
Trisomy 21 (Down syndrome): NEGATIVE

## 2023-08-02 ENCOUNTER — Telehealth: Payer: Self-pay

## 2023-08-02 NOTE — Telephone Encounter (Signed)
 Pt LM on triage line listing several concerns. Called pt back advised her that we received her concerns via mychart however we have 24-48hr turn around policy on calls, informed her that Dr. Estanislao Heimlich is not in office today and we would be in touch as soon as we heard from him. Pt questions if she needs to stay away from others if she is group B + advised pt she does not need to isolate due to group B strep. Also advised pt that it is ok for her OB to treat any UTIs during pregnancy.

## 2023-08-04 MED ORDER — CEPHALEXIN 500 MG PO CAPS: 500.0000 mg | ORAL_CAPSULE | Freq: Two times a day (BID) | ORAL | 0 refills | Status: DC

## 2023-08-04 NOTE — Addendum Note (Signed)
 Addended by: Aracelli Woloszyn M on: 08/04/2023 11:39 AM   Modules accepted: Orders

## 2023-08-23 ENCOUNTER — Encounter: Admitting: Obstetrics

## 2023-08-25 IMAGING — US US OB FOLLOW-UP
1 series · 15 of 28 positions shown · non-contrast
Comparison: none

CLINICAL DATA: Pregnancy.  Follow-up anatomy.

EXAM:
OBSTETRIC 14+ WK ULTRASOUND FOLLOW-UP

[Series 1: us ob follow up · 15 of 32 slices shown]
[im 1/32]
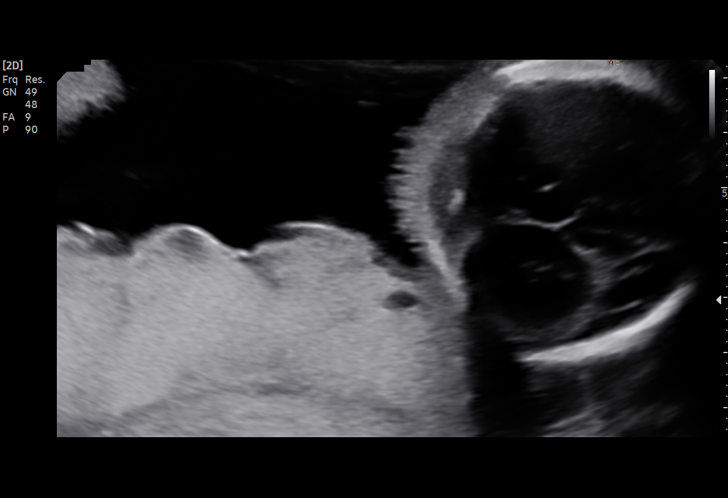
[im 3/32]
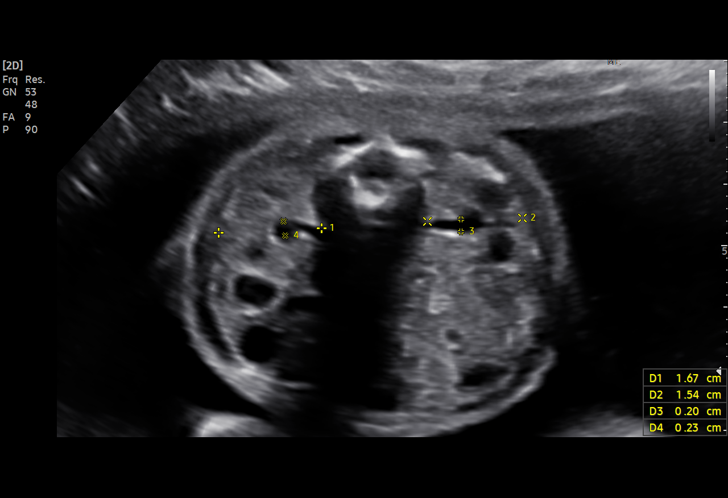
[im 5/32]
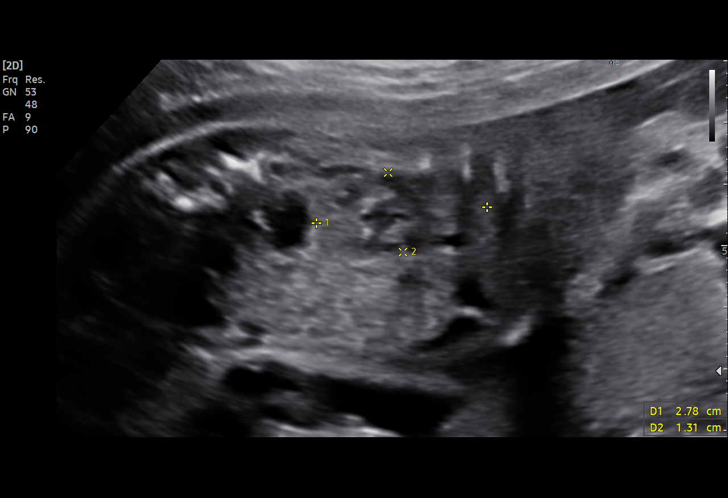
[im 7/32]
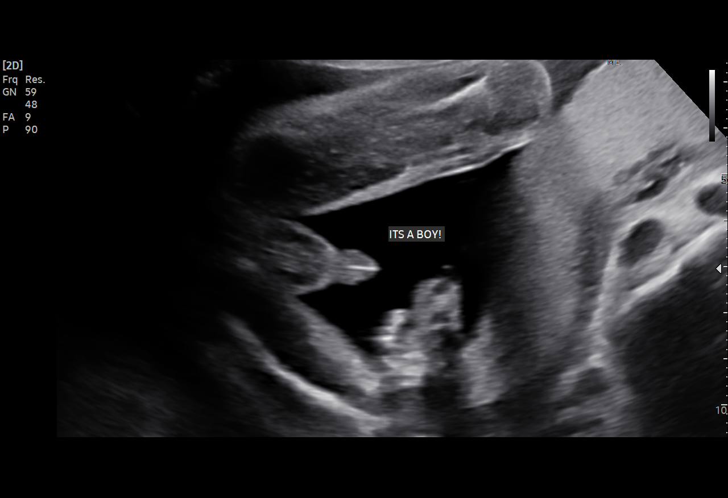
[im 10/32]
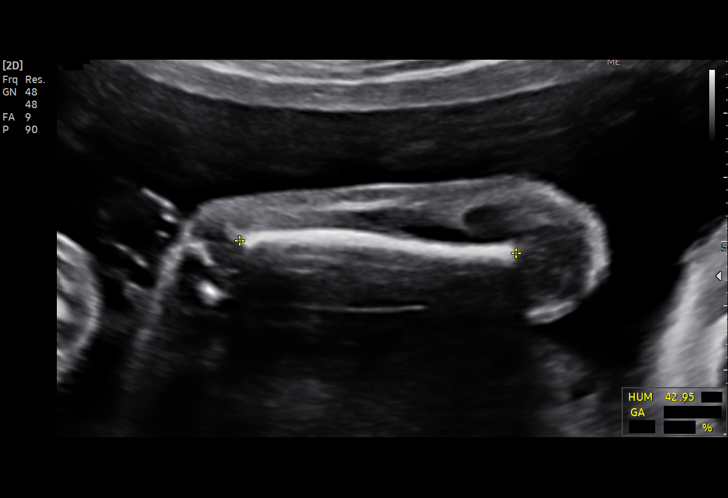
[im 12/32]
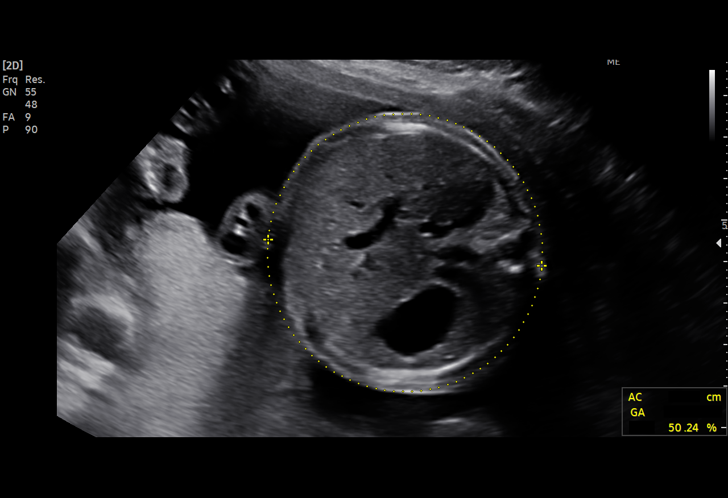
[im 14/32]
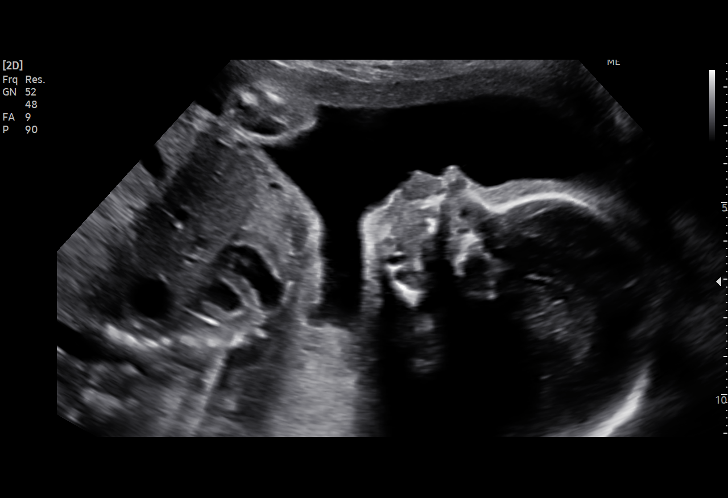
[im 17/32]
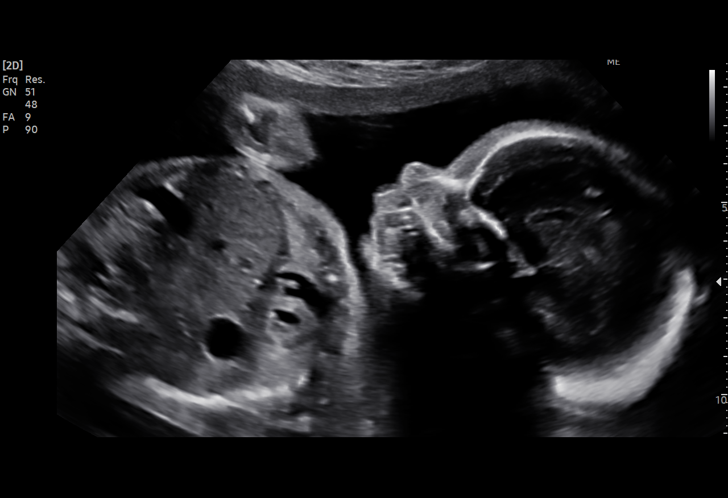
[im 18/32]
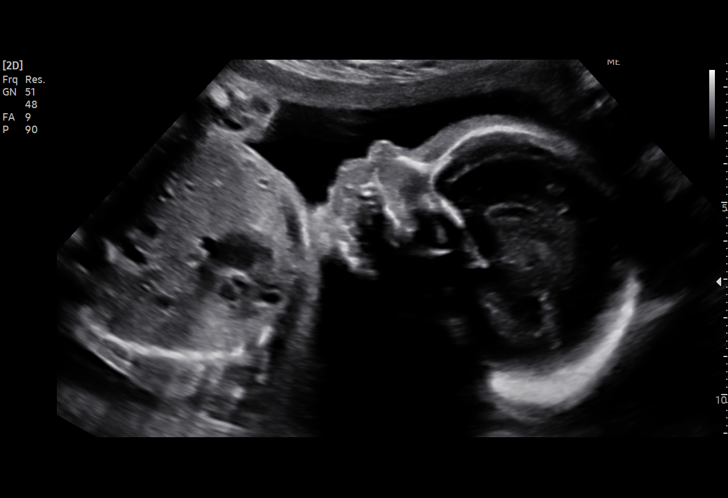
[im 20/32]
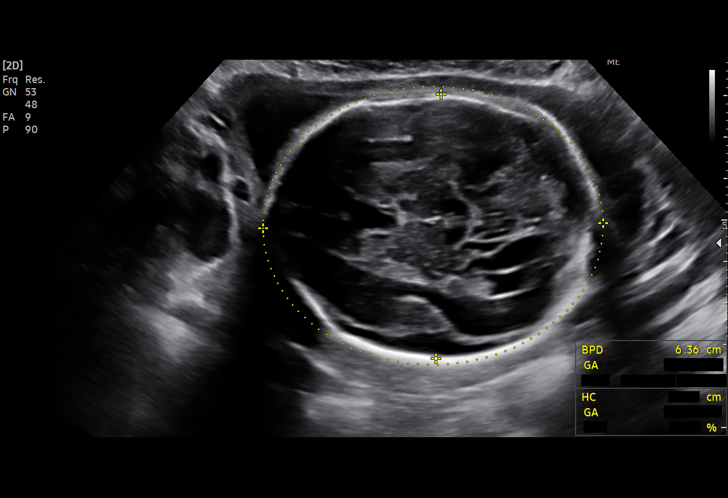
[im 22/32]
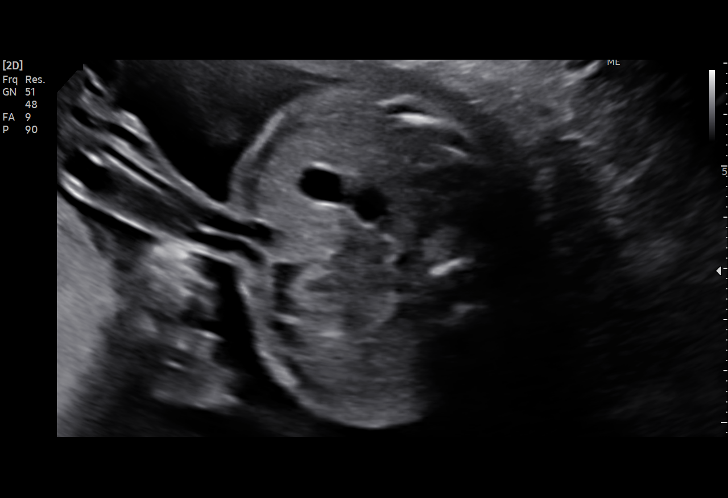
[im 25/32]
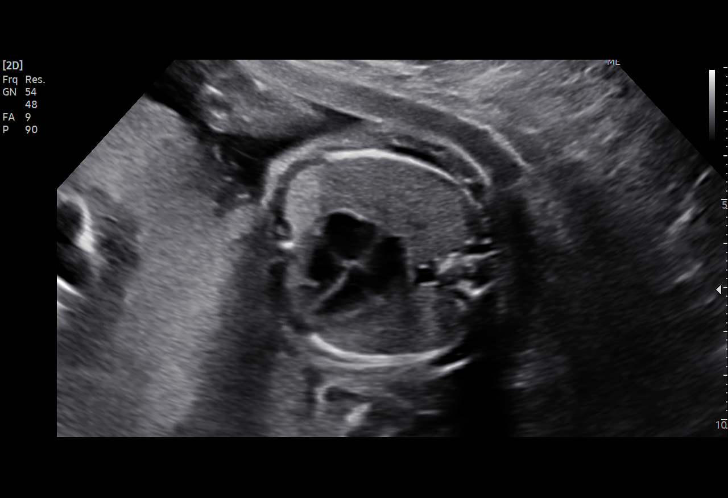
[im 27/32]
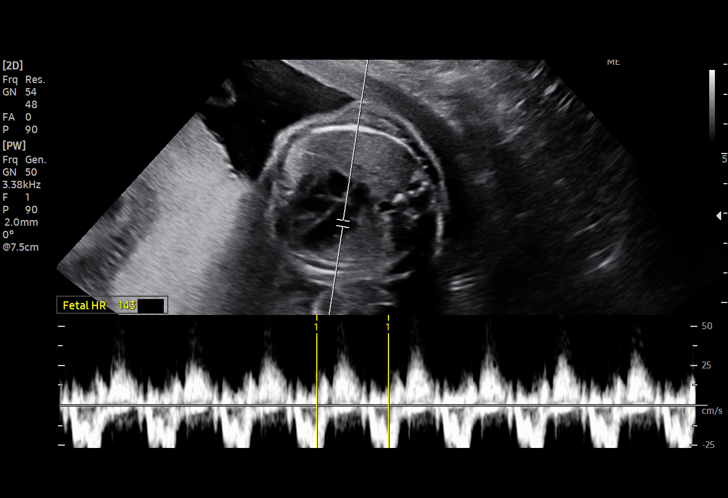
[im 29/32]
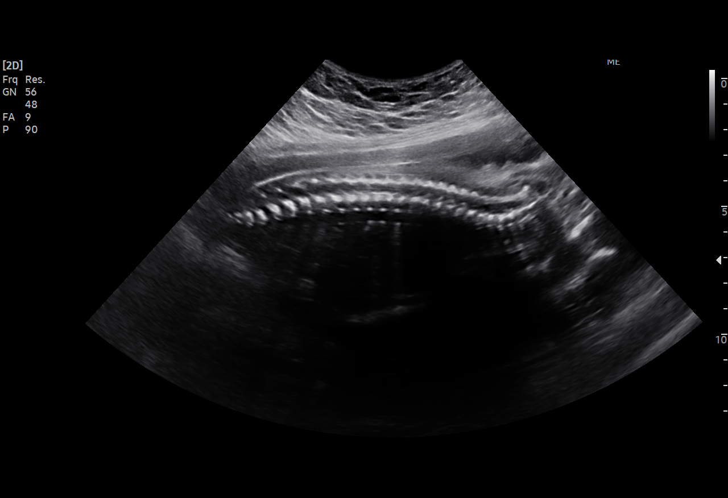
[im 32/32]
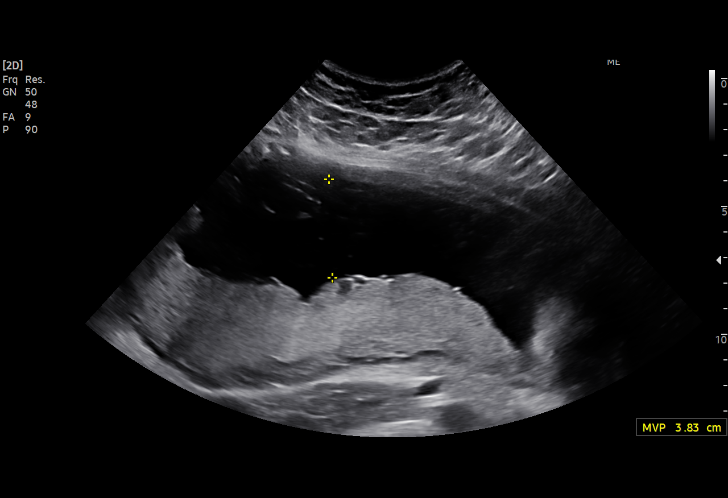

[15 of 28 positions shown; findings below may reference images not displayed]

FINDINGS: Number of Fetuses: 1

Heart Rate:  133 bpm

Movement: Yes

Presentation: Cephalic

Previa: No

Placental Location: Posterior

Amniotic Fluid (Subjective): Normal

Amniotic Fluid (Objective):

Vertical pocket 3.8cm

FETAL BIOMETRY

BPD:  6.4cm 25w 5d

HC:    23.5cm 25w 4d

AC:    20.5cm 25w 1d

FL:    4.5cm 24w 5d

Current Mean GA: 25w 2d US EDC: 05/07/2021

Assigned GA: 25w 1d Assigned EDC: 05/08/2021

Estimated Fetal Weight:  764g 50%ile

FETAL ANATOMY

Lateral Ventricles: Previously seen

Thalami/CSP: Previously seen

Posterior Fossa: Previously seen

Nuchal Region: Previously seen

Upper Lip: Previously seen

Spine: Previously seen

4 Chamber Heart on Left: Previously seen

LVOT: Previously seen

RVOT: Previously seen

Stomach on Left: Previously seen

3 Vessel Cord: Previously seen

Cord Insertion site: Appears normal

Kidneys: Appears normal

Bladder: Previously seen

Extremities: Previously seen

Sex: Previously Seen

Technical Limitations: None.

Maternal Findings:

Cervix:  Closed.  3.4 cm.
IMPRESSION: 1. Single live intrauterine gestation in cephalic presentation.
2. Assigned gestational age of 25 weeks 1 day. Adequate interval
growth.
3. Adequate visualization of the fetal kidneys and cord insertion
site. Fetal anatomic survey is now complete. No fetal anomalies
detected.

## 2023-08-27 ENCOUNTER — Encounter

## 2023-08-30 ENCOUNTER — Ambulatory Visit (INDEPENDENT_AMBULATORY_CARE_PROVIDER_SITE_OTHER): Admitting: Advanced Practice Midwife

## 2023-08-30 ENCOUNTER — Encounter: Payer: Self-pay | Admitting: Advanced Practice Midwife

## 2023-08-30 VITALS — BP 126/75 | HR 80 | Wt 144.9 lb

## 2023-08-30 DIAGNOSIS — Z369 Encounter for antenatal screening, unspecified: Secondary | ICD-10-CM | POA: Diagnosis not present

## 2023-08-30 DIAGNOSIS — Z3A17 17 weeks gestation of pregnancy: Secondary | ICD-10-CM

## 2023-08-30 DIAGNOSIS — Z3689 Encounter for other specified antenatal screening: Secondary | ICD-10-CM

## 2023-08-30 DIAGNOSIS — Z3482 Encounter for supervision of other normal pregnancy, second trimester: Secondary | ICD-10-CM

## 2023-08-30 LAB — POCT URINALYSIS DIPSTICK
Bilirubin, UA: NEGATIVE
Blood, UA: NEGATIVE
Glucose, UA: NEGATIVE
Ketones, UA: NEGATIVE
Leukocytes, UA: NEGATIVE
Nitrite, UA: NEGATIVE
Protein, UA: NEGATIVE
Spec Grav, UA: 1.01 (ref 1.010–1.025)
Urobilinogen, UA: 0.2 U/dL
pH, UA: 6.5 (ref 5.0–8.0)

## 2023-08-30 NOTE — Patient Instructions (Signed)
 Second Trimester of Pregnancy  The second trimester of pregnancy is from week 14 through week 27. This is months 4 through 6 of pregnancy. During the second trimester: Morning sickness is less or has stopped. You may have more energy. You may feel hungry more often. At this time, your unborn baby is growing very fast. At the end of the sixth month, the unborn baby may be up to 12 inches long and weigh about 1 pounds. You will likely start to feel the baby move between 16 and 20 weeks of pregnancy. Body changes during your second trimester Your body continues to change during this time. The changes usually go away after your baby is born. Physical changes You will gain more weight. Your belly will get bigger. You may begin to get stretch marks on your hips, belly, and breasts. Your breasts will keep growing and may hurt. You may get dark spots or blotches on your face. A dark line from your belly button to the pubic area may appear. This line is called linea nigra. Your hair may grow faster and get thicker. Health changes You may have headaches. You may have heartburn. You may pee more often. You may have swollen, bulging veins (varicose veins). You may have trouble pooping (constipation), or swollen veins in the butt that can itch or get painful (hemorrhoids). You may have back pain. This is caused by: Weight gain. Pregnancy hormones that are relaxing the joints in your pelvis. Follow these instructions at home: Medicines Talk to your health care provider if you're taking medicines. Ask if the medicines are safe to take during pregnancy. Your provider may change the medicines that you take. Do not take any medicines unless told to by your provider. Take a prenatal vitamin that has at least 600 micrograms (mcg) of folic acid. Do not use herbal medicines, illegal drugs, or medicines that are not approved by your provider. Eating and drinking While you're pregnant your body needs  extra food for your growing baby. Talk with your provider about what to eat while pregnant. Activity Most women are able to exercise during pregnancy. Exercises may need to change as your pregnancy goes on. Talk to your provider about your activities and exercise routines. Relieving pain and discomfort Wear a good, supportive bra if your breasts hurt. Rest with your legs raised if you have leg cramps or low back pain. Take warm sitz baths to soothe pain from hemorrhoids. Use hemorrhoid cream if your provider says it's okay. Do not douche. Do not use tampons or scented pads. Do not use hot tubs, steam rooms, or saunas. Safety Wear your seatbelt at all times when you're in a car. Talk to your provider if someone hits you, hurts you, or yells at you. Talk with your provider if you're feeling sad or have thoughts of hurting yourself. Lifestyle Certain things can be harmful while you're pregnant. It's best to avoid the following: Do not drink alcohol,smoke, vape, or use products with nicotine or tobacco in them. If you need help quitting, talk with your provider. Avoid cat litter boxes and soil used by cats. These things carry germs that can cause harm to your pregnancy and your baby. General instructions Keep all follow-up visits. It helps you and your unborn baby stay as healthy as possible. Write down your questions. Take them to your prenatal visits. Your provider will: Talk with you about your overall health. Give you advice or refer you to specialists who can help with different needs,  including: Prenatal education classes. Mental health and counseling. Foods and healthy eating. Ask for help if you need help with food. Where to find more information American Pregnancy Association: americanpregnancy.org [employer] of Obstetricians and Gynecologists: acog.org Office on [employer] Health: [employer] Contact a health care provider if: You have a headache that does not go away  when you take medicine. You have any of these problems: You can't eat or drink. You throw up or feel like you may throw up. You have watery poop (diarrhea) for 2 days or more. You have pain when you pee or your pee smells bad. You have been sick for 2 days or more and are not getting better. Contact your provider right away if: You have any of these coming from your vagina: Abnormal discharge. Bad-smelling fluid. Bleeding. Your baby is moving less than usual. You have contractions, belly cramping, or have pain in your pelvis or lower back. You have symptoms of high blood pressure or preeclampsia. These include: A severe, throbbing headache that does not go away. Sudden or extreme swelling of your face, hands, legs, or feet. Vision problems: You see spots. You have blurry vision. Your eyes are sensitive to light. If you can't reach the provider, go to an urgent care or emergency room. Get help right away if: You faint, become confused, or can't think clearly. You have chest pain or trouble breathing. You have any kind of injury, such as from a fall or a car crash. These symptoms may be an emergency. Call 911 right away. Do not wait to see if the symptoms will go away. Do not drive yourself to the hospital. This information is not intended to replace advice given to you by your health care provider. Make sure you discuss any questions you have with your health care provider. Document Revised: 01/07/2023 Document Reviewed: 08/07/2022 Elsevier Patient Education  2024 [employer].

## 2023-08-30 NOTE — Progress Notes (Signed)
 Routine Prenatal Care Visit  Subjective  Madison Lee is a 28 y.o. G3P2002 at [redacted]w[redacted]d being seen today for ongoing prenatal care.  She is currently monitored for the following issues for this low-risk pregnancy and has Acute non-recurrent pansinusitis on their problem list.  ----------------------------------------------------------------------------------- Patient reports she is doing well. Feeling less fatigued. Reviewed treatment for GBS based on GBS in urine. Has been taking Keflex  due to penicillin allergy. Will do swab susceptibility testing in 3rd trimester.   Contractions: Not present. Vag. Bleeding: None.  Movement: Present. Leaking Fluid denies.  ----------------------------------------------------------------------------------- The following portions of the patient's history were reviewed and updated as appropriate: allergies, current medications, past family history, past medical history, past social history, past surgical history and problem list. Problem list updated.  Objective  Blood pressure 126/75, pulse 80, weight 144 lb 14.4 oz (65.7 kg), last menstrual period 04/18/2023. Pregravid weight 144 lb (65.3 kg) Total Weight Gain 14.4 oz (0.408 kg) Urinalysis: Urine Protein    Urine Glucose    Fetal Status: Fetal Heart Rate (bpm): 143   Movement: Present     General:  Alert, oriented and cooperative. Patient is in no acute distress.  Skin: Skin is warm and dry. No rash noted.   Cardiovascular: Normal heart rate noted  Respiratory: Normal respiratory effort, no problems with respiration noted  Abdomen: Soft, gravid, appropriate for gestational age. Pain/Pressure: Absent     Pelvic:  Cervical exam deferred        Extremities: Normal range of motion.  Edema: None  Mental Status: Normal mood and affect. Normal behavior. Normal judgment and thought content.   Assessment   28 y.o. G3P2002 at [redacted]w[redacted]d by  02/05/2024, by Ultrasound presenting for routine prenatal visit  Plan    pregnancy 3  Problems (from 06/07/23 to present)     Problem Noted Diagnosed Resolved   Supervision of other normal pregnancy, antepartum 09/17/2020 by Alicia Apa, CNM  06/07/2023 by Rian Chafe, CMA   Overview Addendum 04/22/2021  9:28 AM by Frederich Jaksch, CNM   [facility] Prenatal Labs  Dating LMP, 12wk us  Blood type: A/Positive/-- (06/30 1434) A+  Genetic Screen 1 Screen:    AFP:     Quad:     NIPS:INheritest neg Antibody:Negative (06/30 1434)neg  Anatomic US  complete Rubella: 2.71 (06/30 1434)immune  GTT 126 RPR: Non Reactive (06/30 1434) NR  Flu vaccine 12/30/20 HBsAg: Negative (06/30 1434) neg  TDaP vaccine          Rhogam: NA HIV: Non Reactive (06/30 1434) NR  Baby Food                Breast                               [id number] (12/27 1057)(For PCN allergy, check sensitivities)  Contraception POP, ring Pap:5/31  Circumcision yes  Hep C negative  Pediatrician [facility]   Support Person Myrtie Atkinson                  Preterm labor symptoms and general obstetric precautions including but not limited to vaginal bleeding, contractions, leaking of fluid and fetal movement were reviewed in detail with the patient. Please refer to After Visit Summary for other counseling recommendations.   Return in about 3 weeks (around 09/20/2023) for anatomy scan and rob after.  Angelita Kendall, CNM 08/30/2023 11:00 AM

## 2023-09-01 LAB — URINE CULTURE

## 2023-09-03 ENCOUNTER — Other Ambulatory Visit: Payer: Self-pay | Admitting: Advanced Practice Midwife

## 2023-09-03 ENCOUNTER — Ambulatory Visit: Payer: Self-pay | Admitting: Advanced Practice Midwife

## 2023-09-03 DIAGNOSIS — B951 Streptococcus, group B, as the cause of diseases classified elsewhere: Secondary | ICD-10-CM

## 2023-09-03 MED ORDER — CLINDAMYCIN HCL 300 MG PO CAPS: 300.0000 mg | ORAL_CAPSULE | Freq: Three times a day (TID) | ORAL | 0 refills | Status: DC

## 2023-09-03 NOTE — Progress Notes (Signed)
 Rx clinda sent to treat GBS in urine (clinda susceptible)

## 2023-09-22 ENCOUNTER — Ambulatory Visit
Admission: RE | Admit: 2023-09-22 | Discharge: 2023-09-22 | Disposition: A | Discharge status: Home / Self Care | Source: Ambulatory Visit | Attending: Advanced Practice Midwife | Admitting: Advanced Practice Midwife

## 2023-09-22 DIAGNOSIS — Z3689 Encounter for other specified antenatal screening: Secondary | ICD-10-CM | POA: Insufficient documentation

## 2023-09-22 DIAGNOSIS — Z3482 Encounter for supervision of other normal pregnancy, second trimester: Secondary | ICD-10-CM | POA: Diagnosis present

## 2023-09-22 DIAGNOSIS — Z369 Encounter for antenatal screening, unspecified: Secondary | ICD-10-CM | POA: Diagnosis not present

## 2023-09-22 DIAGNOSIS — Z3A20 20 weeks gestation of pregnancy: Secondary | ICD-10-CM | POA: Diagnosis not present

## 2023-09-23 DIAGNOSIS — Z22330 Carrier of Group B streptococcus: Secondary | ICD-10-CM | POA: Insufficient documentation

## 2023-09-23 HISTORY — DX: Carrier of group B Streptococcus: Z22.330

## 2023-09-24 ENCOUNTER — Ambulatory Visit (INDEPENDENT_AMBULATORY_CARE_PROVIDER_SITE_OTHER): Admitting: Certified Nurse Midwife

## 2023-09-24 VITALS — BP 110/73 | HR 64 | Wt 145.4 lb

## 2023-09-24 DIAGNOSIS — Z3480 Encounter for supervision of other normal pregnancy, unspecified trimester: Secondary | ICD-10-CM

## 2023-09-24 DIAGNOSIS — Z363 Encounter for antenatal screening for malformations: Secondary | ICD-10-CM | POA: Diagnosis not present

## 2023-09-24 DIAGNOSIS — Z3A20 20 weeks gestation of pregnancy: Secondary | ICD-10-CM

## 2023-09-24 DIAGNOSIS — Z22330 Carrier of Group B streptococcus: Secondary | ICD-10-CM | POA: Diagnosis not present

## 2023-09-24 NOTE — Patient Instructions (Signed)
 Second Trimester of Pregnancy  The second trimester of pregnancy is from week 14 through week 27. This is months 4 through 6 of pregnancy. During the second trimester: Morning sickness is less or has stopped. You may have more energy. You may feel hungry more often. At this time, your unborn baby is growing very fast. At the end of the sixth month, the unborn baby may be up to 12 inches long and weigh about 1 pounds. You will likely start to feel the baby move between 16 and 20 weeks of pregnancy. Body changes during your second trimester Your body continues to change during this time. The changes usually go away after your baby is born. Physical changes You will gain more weight. Your belly will get bigger. You may begin to get stretch marks on your hips, belly, and breasts. Your breasts will keep growing and may hurt. You may get dark spots or blotches on your face. A dark line from your belly button to the pubic area may appear. This line is called linea nigra. Your hair may grow faster and get thicker. Health changes You may have headaches. You may have heartburn. You may pee more often. You may have swollen, bulging veins (varicose veins). You may have trouble pooping (constipation), or swollen veins in the butt that can itch or get painful (hemorrhoids). You may have back pain. This is caused by: Weight gain. Pregnancy hormones that are relaxing the joints in your pelvis. Follow these instructions at home: Medicines Talk to your health care provider if you're taking medicines. Ask if the medicines are safe to take during pregnancy. Your provider may change the medicines that you take. Do not take any medicines unless told to by your provider. Take a prenatal vitamin that has at least 600 micrograms (mcg) of folic acid. Do not use herbal medicines, illegal drugs, or medicines that are not approved by your provider. Eating and drinking While you're pregnant your body needs  extra food for your growing baby. Talk with your provider about what to eat while pregnant. Activity Most women are able to exercise during pregnancy. Exercises may need to change as your pregnancy goes on. Talk to your provider about your activities and exercise routines. Relieving pain and discomfort Wear a good, supportive bra if your breasts hurt. Rest with your legs raised if you have leg cramps or low back pain. Take warm sitz baths to soothe pain from hemorrhoids. Use hemorrhoid cream if your provider says it's okay. Do not douche. Do not use tampons or scented pads. Do not use hot tubs, steam rooms, or saunas. Safety Wear your seatbelt at all times when you're in a car. Talk to your provider if someone hits you, hurts you, or yells at you. Talk with your provider if you're feeling sad or have thoughts of hurting yourself. Lifestyle Certain things can be harmful while you're pregnant. It's best to avoid the following: Do not drink alcohol,smoke, vape, or use products with nicotine or tobacco in them. If you need help quitting, talk with your provider. Avoid cat litter boxes and soil used by cats. These things carry germs that can cause harm to your pregnancy and your baby. General instructions Keep all follow-up visits. It helps you and your unborn baby stay as healthy as possible. Write down your questions. Take them to your prenatal visits. Your provider will: Talk with you about your overall health. Give you advice or refer you to specialists who can help with different needs,  including: Prenatal education classes. Mental health and counseling. Foods and healthy eating. Ask for help if you need help with food. Where to find more information American Pregnancy Association: americanpregnancy.org [employer] of Obstetricians and Gynecologists: acog.org Office on [employer] Health: [employer] Contact a health care provider if: You have a headache that does not go away  when you take medicine. You have any of these problems: You can't eat or drink. You throw up or feel like you may throw up. You have watery poop (diarrhea) for 2 days or more. You have pain when you pee or your pee smells bad. You have been sick for 2 days or more and are not getting better. Contact your provider right away if: You have any of these coming from your vagina: Abnormal discharge. Bad-smelling fluid. Bleeding. Your baby is moving less than usual. You have contractions, belly cramping, or have pain in your pelvis or lower back. You have symptoms of high blood pressure or preeclampsia. These include: A severe, throbbing headache that does not go away. Sudden or extreme swelling of your face, hands, legs, or feet. Vision problems: You see spots. You have blurry vision. Your eyes are sensitive to light. If you can't reach the provider, go to an urgent care or emergency room. Get help right away if: You faint, become confused, or can't think clearly. You have chest pain or trouble breathing. You have any kind of injury, such as from a fall or a car crash. These symptoms may be an emergency. Call 911 right away. Do not wait to see if the symptoms will go away. Do not drive yourself to the hospital. This information is not intended to replace advice given to you by your health care provider. Make sure you discuss any questions you have with your health care provider. Document Revised: 01/07/2023 Document Reviewed: 08/07/2022 Elsevier Patient Education  2024 [employer].

## 2023-09-24 NOTE — Progress Notes (Signed)
    Return Prenatal Note   Subjective   28 y.o. [id number] at [redacted]w[redacted]d presents for this follow-up prenatal visit.  Patient feeling well, has not gained weight this pregnancy. Reports that at ultrasound yesterday despite 2h long exam they were not able to see baby's kidneys. Patient reports: Movement: Present Contractions: Not present  Objective   Flow sheet Vitals: Pulse Rate: 64 BP: 110/73 Fundal Height: 20 cm Fetal Heart Rate (bpm): 150 Total weight gain: 1 lb 6.4 oz (0.635 kg)  General Appearance  No acute distress, well appearing, and well nourished Pulmonary   Normal work of breathing Neurologic   Alert and oriented to person, place, and time Psychiatric   Mood and affect within normal limits  Assessment/Plan   Plan  28 y.o. [id number] at [redacted]w[redacted]d presents for follow-up OB visit. Reviewed prenatal record including previous visit note.  Supervision of other normal pregnancy, antepartum Red flag symptoms reviewed. Follow up ultrasound for completion of anatomy ordered.      Orders Placed This Encounter  Procedures   US  OB Follow Up    Standing Status:   Future    Expected Date:   10/24/2023    Expiration Date:   12/25/2023    Reason for exam::   incomplete anatomy    Preferred imaging location?:   Internal   Return in 4 weeks (on 10/22/2023) for ROB.   Future Appointments  Date Time Provider Department Center  10/25/2023 10:15 AM Angelita Kendall, CNM [facility] None    For next visit:  continue with routine prenatal care     Forestine Igo, CNM  09/23/2508:34 AM

## 2023-09-24 NOTE — Assessment & Plan Note (Signed)
 Red flag symptoms reviewed. Follow up ultrasound for completion of anatomy ordered.

## 2023-10-04 ENCOUNTER — Other Ambulatory Visit: Payer: Self-pay | Admitting: Advanced Practice Midwife

## 2023-10-25 ENCOUNTER — Ambulatory Visit (INDEPENDENT_AMBULATORY_CARE_PROVIDER_SITE_OTHER): Admitting: Advanced Practice Midwife

## 2023-10-25 ENCOUNTER — Other Ambulatory Visit: Payer: Self-pay

## 2023-10-25 ENCOUNTER — Encounter: Payer: Self-pay | Admitting: Advanced Practice Midwife

## 2023-10-25 ENCOUNTER — Ambulatory Visit

## 2023-10-25 VITALS — BP 106/69 | HR 73 | Wt 150.9 lb

## 2023-10-25 DIAGNOSIS — Z363 Encounter for antenatal screening for malformations: Secondary | ICD-10-CM

## 2023-10-25 DIAGNOSIS — Z113 Encounter for screening for infections with a predominantly sexual mode of transmission: Secondary | ICD-10-CM

## 2023-10-25 DIAGNOSIS — Z3A25 25 weeks gestation of pregnancy: Secondary | ICD-10-CM

## 2023-10-25 DIAGNOSIS — Z3482 Encounter for supervision of other normal pregnancy, second trimester: Secondary | ICD-10-CM

## 2023-10-25 DIAGNOSIS — Z130 Encounter for screening for diseases of the blood and blood-forming organs and certain disorders involving the immune mechanism: Secondary | ICD-10-CM

## 2023-10-25 DIAGNOSIS — Z369 Encounter for antenatal screening, unspecified: Secondary | ICD-10-CM

## 2023-10-25 DIAGNOSIS — Z131 Encounter for screening for diabetes mellitus: Secondary | ICD-10-CM

## 2023-10-25 DIAGNOSIS — Z87440 Personal history of urinary (tract) infections: Secondary | ICD-10-CM | POA: Diagnosis not present

## 2023-10-25 DIAGNOSIS — O09892 Supervision of other high risk pregnancies, second trimester: Secondary | ICD-10-CM

## 2023-10-25 NOTE — Progress Notes (Signed)
 Routine Prenatal Care Visit  Subjective  Madison Lee is a 28 y.o. G3P2002 at [redacted]w[redacted]d being seen today for ongoing prenatal care.  She is currently monitored for the following issues for this low-risk pregnancy and has Acute non-recurrent pansinusitis; Supervision of other normal pregnancy, antepartum; and GBS carrier on their problem list.  ----------------------------------------------------------------------------------- Patient reports some pelvic area pressure. No cramping or urinary symptoms. Will do TOC for GBS UTI today.   Contractions: Not present. Vag. Bleeding: None.  Movement: Present. Leaking Fluid denies.  ----------------------------------------------------------------------------------- The following portions of the patient's history were reviewed and updated as appropriate: allergies, current medications, past family history, past medical history, past social history, past surgical history and problem list. Problem list updated.  Objective  Blood pressure 106/69, pulse 73, weight 150 lb 14.4 oz (68.4 kg), last menstrual period 04/18/2023. Pregravid weight 144 lb (65.3 kg) Total Weight Gain 6 lb 14.4 oz (3.13 kg) Urinalysis: Urine Protein    Urine Glucose    Fetal Status: Fetal Heart Rate (bpm): 143 Fundal Height: 25 cm Movement: Present     General:  Alert, oriented and cooperative. Patient is in no acute distress.  Skin: Skin is warm and dry. No rash noted.   Cardiovascular: Normal heart rate noted  Respiratory: Normal respiratory effort, no problems with respiration noted  Abdomen: Soft, gravid, appropriate for gestational age. Pain/Pressure: Present     Pelvic:  Cervical exam deferred        Extremities: Normal range of motion.  Edema: None  Mental Status: Normal mood and affect. Normal behavior. Normal judgment and thought content.   Assessment   28 y.o. G3P2002 at [redacted]w[redacted]d by  02/05/2024, by Ultrasound presenting for routine prenatal visit  Plan   pregnancy 3   Problems (from 06/07/23 to present)     Problem Noted Diagnosed Resolved   GBS carrier 09/23/2023 by Jayne Harlene LITTIE, CNM  No   Overview Signed 09/23/2023  2:51 PM by Jayne Harlene LITTIE, CNM  UTI, clinda sensitive      Supervision of other normal pregnancy, antepartum 09/17/2020 by Carlin Rollene HERO, CNM  06/07/2023 by Jakie Harlene, CMA   Clinical Staff Provider  Office Location  [facility] Dating  01/23/2024, by Last Menstrual Period  Language  English Anatomy US     Flu Vaccine   Genetic Screen  NIPS:   TDaP vaccine   offer Hgb A1C or  GTT Early : Third trimester :   Covid    LAB RESULTS   Rhogam  A/Positive/-- (04/07 1053)  Blood Type A/Positive/-- (04/07 1053)   RSV  Antibody Negative (04/07 1053)  Feeding Plan Breastfeeding Rubella 2.54 (04/07 1053)  Contraception ring RPR Non Reactive (04/07 1053)   Circumcision yes HBsAg Negative (04/07 1053)   Pediatrician  [facility] HIV Non Reactive (04/07 1053)  Support Person david Varicella Reactive (04/07 1053)  Prenatal Classes  GBS  (For PCN allergy, check sensitivities) +GBS in urine    Hep C Non Reactive (04/07 1053)   BTL Consent  Pap Diagnosis  Date Value Ref Range Status  06/24/2022 (A)  Final   - Atypical squamous cells of undetermined significance (ASC-US )    VBAC Consent  Hgb Electro      CF      SMA           Preterm labor symptoms and general obstetric precautions including but not limited to vaginal bleeding, contractions, leaking of fluid and fetal movement were reviewed in detail with the patient. Please  refer to After Visit Summary for other counseling recommendations.   Return in about 3 weeks (around 11/15/2023) for 28wk labs and rob.  Slater Rains, CNM 10/25/2023 11:02 AM

## 2023-10-27 ENCOUNTER — Ambulatory Visit: Payer: Self-pay | Admitting: Advanced Practice Midwife

## 2023-10-27 LAB — URINE CULTURE

## 2023-11-15 ENCOUNTER — Other Ambulatory Visit

## 2023-11-15 ENCOUNTER — Encounter: Payer: Self-pay | Admitting: Licensed Practical Nurse

## 2023-11-15 ENCOUNTER — Ambulatory Visit: Admitting: Licensed Practical Nurse

## 2023-11-15 VITALS — BP 112/74 | HR 69 | Wt 154.9 lb

## 2023-11-15 DIAGNOSIS — Z3A28 28 weeks gestation of pregnancy: Secondary | ICD-10-CM | POA: Diagnosis not present

## 2023-11-15 DIAGNOSIS — Z130 Encounter for screening for diseases of the blood and blood-forming organs and certain disorders involving the immune mechanism: Secondary | ICD-10-CM | POA: Diagnosis not present

## 2023-11-15 DIAGNOSIS — Z23 Encounter for immunization: Secondary | ICD-10-CM

## 2023-11-15 DIAGNOSIS — Z3483 Encounter for supervision of other normal pregnancy, third trimester: Secondary | ICD-10-CM | POA: Diagnosis not present

## 2023-11-15 DIAGNOSIS — Z3482 Encounter for supervision of other normal pregnancy, second trimester: Secondary | ICD-10-CM | POA: Diagnosis not present

## 2023-11-15 DIAGNOSIS — Z3480 Encounter for supervision of other normal pregnancy, unspecified trimester: Secondary | ICD-10-CM

## 2023-11-15 DIAGNOSIS — Z131 Encounter for screening for diabetes mellitus: Secondary | ICD-10-CM | POA: Diagnosis not present

## 2023-11-15 DIAGNOSIS — Z369 Encounter for antenatal screening, unspecified: Secondary | ICD-10-CM | POA: Diagnosis not present

## 2023-11-15 DIAGNOSIS — Z113 Encounter for screening for infections with a predominantly sexual mode of transmission: Secondary | ICD-10-CM | POA: Diagnosis not present

## 2023-11-15 NOTE — Progress Notes (Signed)
    Return Prenatal Note   Subjective   28 y.o. G3P2002 at [redacted]w[redacted]d presents for this follow-up prenatal visit.  Patient  Patient reports: Doing well. Feels good, mood has been good. [place]. Desires US  to check position later in pregnancy as fetus was transverse at follow up US . Planning to labor without an epidural, pain management options reviewed   Movement: Present Contractions: Not present  Objective   Flow sheet Vitals: Pulse Rate: 69 BP: 112/74 Fundal Height: 27 cm Fetal Heart Rate (bpm): 150 Total weight gain: 10 lb 14.4 oz (4.944 kg)  General Appearance  No acute distress, well appearing, and well nourished Pulmonary   Normal work of breathing Neurologic   Alert and oriented to person, place, and time Psychiatric   Mood and affect within normal limits  Assessment/Plan   Plan  28 y.o. [id number] at [redacted]w[redacted]d presents for follow-up OB visit. Reviewed prenatal record including previous visit note.  Supervision of other normal pregnancy, antepartum -TWG 10lbs, BMI 24. Is eating a lot healthier this pregnancy, has a good appetite -Exclusively breastfed for 6 months then pumped for a few more months with her first town children, palns to do the same, had a good experience -28 wk labs and TDAP done today -warning signs reviewed      Orders Placed This Encounter  Procedures   Tdap vaccine greater than or equal to 7yo IM   Return in about 2 weeks (around 11/29/2023) for ROB.   No future appointments.  For next visit:  continue with routine prenatal care     JINNIE HERO [facility], CNM  07/28/259:17 AM

## 2023-11-15 NOTE — Assessment & Plan Note (Signed)
-  TWG 10lbs, BMI 24. Is eating a lot healthier this pregnancy, has a good appetite -Exclusively breastfed for 6 months then pumped for a few more months with her first town children, palns to do the same, had a good experience -28 wk labs and TDAP done today -warning signs reviewed

## 2023-11-16 LAB — 28 WEEK RH+PANEL
Basophils Absolute: 0 x10E3/uL (ref 0.0–0.2)
Basos: 0 %
EOS (ABSOLUTE): 0 x10E3/uL (ref 0.0–0.4)
Eos: 1 %
Gestational Diabetes Screen: 107 mg/dL (ref 70–139)
HIV Screen 4th Generation wRfx: NONREACTIVE
Hematocrit: 36.5 % (ref 34.0–46.6)
Hemoglobin: 12 g/dL (ref 11.1–15.9)
Immature Grans (Abs): 0 x10E3/uL (ref 0.0–0.1)
Immature Granulocytes: 0 %
Lymphocytes Absolute: 1.8 x10E3/uL (ref 0.7–3.1)
Lymphs: 25 %
MCH: 30.2 pg (ref 26.6–33.0)
MCHC: 32.9 g/dL (ref 31.5–35.7)
MCV: 92 fL (ref 79–97)
Monocytes Absolute: 0.4 x10E3/uL (ref 0.1–0.9)
Monocytes: 6 %
Neutrophils Absolute: 4.9 x10E3/uL (ref 1.4–7.0)
Neutrophils: 68 %
Platelets: 171 x10E3/uL (ref 150–450)
RBC: 3.98 x10E6/uL (ref 3.77–5.28)
RDW: 12.3 % (ref 11.7–15.4)
RPR Ser Ql: NONREACTIVE
WBC: 7.2 x10E3/uL (ref 3.4–10.8)

## 2023-12-01 ENCOUNTER — Encounter: Payer: Self-pay | Admitting: Licensed Practical Nurse

## 2023-12-01 ENCOUNTER — Ambulatory Visit (INDEPENDENT_AMBULATORY_CARE_PROVIDER_SITE_OTHER): Admitting: Licensed Practical Nurse

## 2023-12-01 VITALS — BP 117/78 | HR 82 | Wt 153.7 lb

## 2023-12-01 DIAGNOSIS — Z3A30 30 weeks gestation of pregnancy: Secondary | ICD-10-CM | POA: Diagnosis not present

## 2023-12-01 DIAGNOSIS — Z3480 Encounter for supervision of other normal pregnancy, unspecified trimester: Secondary | ICD-10-CM

## 2023-12-01 NOTE — Progress Notes (Signed)
    Return Prenatal Note   Subjective   28 y.o. [id number] at [redacted]w[redacted]d presents for this follow-up prenatal visit.  Patient Here with her 2 children  Patient reports:Doing well, no concerns. Feels fetus has flipped to a vertical position-she feels a lot in the pelvis. Has some B-H ctx, nothing consistent or painful. Asked about platelets, platelets were 170 with 28wks, reassured WNL, we are concerned at less than 150 but more so when they are less than 100. .  Movement: Present Contractions: Irritability  Objective   Flow sheet Vitals: Pulse Rate: 82 BP: 117/78 Fundal Height:  (30.5) Fetal Heart Rate (bpm): 135 Total weight gain: 9 lb 11.2 oz (4.4 kg)  General Appearance  No acute distress, well appearing, and well nourished Pulmonary   Normal work of breathing Neurologic   Alert and oriented to person, place, and time Psychiatric   Mood and affect within normal limits  Assessment/Plan   Plan  28 y.o. [id number] at [redacted]w[redacted]d presents for follow-up OB visit. Reviewed prenatal record including previous visit note.  Supervision of other normal pregnancy, antepartum -TWG 9 lbs, pt reports she is eating healthy and well. Last US  with appropriate growth, S=D, consider another growth US  later if weight gain continues to be low.  -warning signs reviewed.      No orders of the defined types were placed in this encounter.  Return in about 2 weeks (around 12/15/2023) for ROB.   Future Appointments  Date Time Provider Department Center  12/16/2023  8:15 AM Windle Huebert, Jinnie Jansky, CNM [facility] None    For next visit:  continue with routine prenatal care     JINNIE HERO [facility], CNM  08/13/251:13 PM

## 2023-12-01 NOTE — Assessment & Plan Note (Signed)
-  TWG 9 lbs, pt reports she is eating healthy and well. Last US  with appropriate growth, S=D, consider another growth US  later if weight gain continues to be low.  -warning signs reviewed.

## 2023-12-16 ENCOUNTER — Ambulatory Visit (INDEPENDENT_AMBULATORY_CARE_PROVIDER_SITE_OTHER): Admitting: Licensed Practical Nurse

## 2023-12-16 ENCOUNTER — Encounter: Payer: Self-pay | Admitting: Licensed Practical Nurse

## 2023-12-16 VITALS — BP 129/83 | HR 84 | Wt 156.3 lb

## 2023-12-16 DIAGNOSIS — Z3480 Encounter for supervision of other normal pregnancy, unspecified trimester: Secondary | ICD-10-CM

## 2023-12-16 DIAGNOSIS — Z3A32 32 weeks gestation of pregnancy: Secondary | ICD-10-CM | POA: Diagnosis not present

## 2023-12-16 NOTE — Assessment & Plan Note (Signed)
-  TWG 12lbs, which is ok -Does not desire another pregnancy, planning Mirena for PP contraception  -warning signs reviewed

## 2023-12-16 NOTE — Progress Notes (Signed)
    Return Prenatal Note   Subjective   28 y.o. [id number] at [redacted]w[redacted]d presents for this follow-up prenatal visit.  Patient here with her son Patient reports:Doing well, no concerns  Has occasional contractions especially when she is not hydrated.  Movement: Present Contractions: Irritability  Objective   Flow sheet Vitals: Pulse Rate: 84 BP: 129/83 Fundal Height: 33 cm Fetal Heart Rate (bpm): 155 Total weight gain: 12 lb 4.8 oz (5.579 kg)  General Appearance  No acute distress, well appearing, and well nourished Pulmonary   Normal work of breathing Neurologic   Alert and oriented to person, place, and time Psychiatric   Mood and affect within normal limits   Assessment/Plan   Plan  28 y.o. [id number] at [redacted]w[redacted]d presents for follow-up OB visit. Reviewed prenatal record including previous visit note.  Supervision of other normal pregnancy, antepartum -TWG 12lbs, which is ok -Does not desire another pregnancy, planning Mirena for PP contraception  -warning signs reviewed      No orders of the defined types were placed in this encounter.  Return in about 2 weeks (around 12/30/2023) for ROB.   Future Appointments  Date Time Provider Department Center  12/31/2023  8:15 AM Justino Eleanor HERO, CNM [facility] None    For next visit:  continue with routine prenatal care     JINNIE HERO [facility], CNM  12/16/2510:53 PM

## 2023-12-31 ENCOUNTER — Ambulatory Visit (INDEPENDENT_AMBULATORY_CARE_PROVIDER_SITE_OTHER): Admitting: Obstetrics

## 2023-12-31 VITALS — BP 110/77 | HR 89 | Wt 155.9 lb

## 2023-12-31 DIAGNOSIS — Z23 Encounter for immunization: Secondary | ICD-10-CM

## 2023-12-31 DIAGNOSIS — Z3480 Encounter for supervision of other normal pregnancy, unspecified trimester: Secondary | ICD-10-CM

## 2023-12-31 DIAGNOSIS — Z3483 Encounter for supervision of other normal pregnancy, third trimester: Secondary | ICD-10-CM | POA: Diagnosis not present

## 2023-12-31 DIAGNOSIS — Z3A34 34 weeks gestation of pregnancy: Secondary | ICD-10-CM | POA: Diagnosis not present

## 2023-12-31 DIAGNOSIS — Z2911 Encounter for prophylactic immunotherapy for respiratory syncytial virus (RSV): Secondary | ICD-10-CM

## 2023-12-31 NOTE — Progress Notes (Signed)
     Return Prenatal Note   Subjective   28 y.o. H6E7997 at [redacted]w[redacted]d presents for this follow-up prenatal visit.  Patient has no c/o. Hoping for unmedicated delivery. Hx elective IOL at 39 wks. Wants spontaneous labor this time.  Patient reports: Movement: Present Contractions: Irritability  Objective   Flow sheet Vitals: Pulse Rate: 89 BP: 110/77 Fundal Height: 35 cm Fetal Heart Rate (bpm): 150 Presentation: Vertex Total weight gain: 11 lb 14.4 oz (5.398 kg)  General Appearance  No acute distress, well appearing, and well nourished Pulmonary   Normal work of breathing Neurologic   Alert and oriented to person, place, and time Psychiatric   Mood and affect within normal limits   Assessment/Plan   Plan  28 y.o. [id number] at [redacted]w[redacted]d presents for follow-up OB visit. Reviewed prenatal record including previous visit note.  Supervision of other normal pregnancy, antepartum -Reviewed labor warning signs and expectations for birth. Instructed to call office or come to hospital with persistent headache, vision changes, regular contractions, leaking of fluid, decreased fetal movement or vaginal bleeding.  -RSV and Flu vaccines administered today.  -Plan BSUS to confirm presentation at next visit. Will not need GBS testing as she tested positive in urine earlier in pregnancy.      Orders Placed This Encounter  Procedures   Respiratory syncytial virus vaccine, preF, subunit, bivalent,(Abrysvo)   Flu vaccine trivalent PF, 6mos and older(Flulaval,Afluria,Fluarix,Fluzone)   No follow-ups on file.   No future appointments.  For next visit:  continue with routine prenatal care     Charma DOMINO, CNM  09/12/258:43 AM

## 2023-12-31 NOTE — Assessment & Plan Note (Signed)
-  Reviewed labor warning signs and expectations for birth. Instructed to call office or come to hospital with persistent headache, vision changes, regular contractions, leaking of fluid, decreased fetal movement or vaginal bleeding.  -RSV and Flu vaccines administered today.

## 2024-01-12 NOTE — Progress Notes (Addendum)
    Return Prenatal Note   Subjective   28 y.o. H6E7997 at [redacted]w[redacted]d presents for this follow-up prenatal visit.  Patient here with her son  Patient reports: Doing well. Will stop working after tomorrow. No concerns today.  -She would appreciate heplocking Iv while in labor  Movement: Present Contractions: Irritability  Objective   Flow sheet Vitals: Fundal Height: 36 cm Fetal Heart Rate (bpm): 150 Presentation: Vertex Total weight gain: 14 lb (6.35 kg)  General Appearance  No acute distress, well appearing, and well nourished Pulmonary   Normal work of breathing Neurologic   Alert and oriented to person, place, and time Psychiatric   Mood and affect within normal limits   Assessment/Plan   Plan  28 y.o. [id number] at [redacted]w[redacted]d presents for follow-up OB visit. Reviewed prenatal record including previous visit note.  Supervision of other normal pregnancy, antepartum -TWG 14lbs, which a little low but ok, gained 3lbs since last visit  -gc/ct collected  -warning signs reviewed      No orders of the defined types were placed in this encounter.  Return in about 1 week (around 01/21/2024) for ROB.   Future Appointments  Date Time Provider Department Center  01/20/2024 11:15 AM Charma Domino, CNM [facility] None  01/28/2024  9:35 AM Derwin Reddy, Jinnie Jansky, CNM [facility] None     For next visit:  continue with routine prenatal care     JINNIE HERO [facility], CNM  01/14/2511:53 PM

## 2024-01-13 NOTE — Patient Instructions (Signed)
 Third Trimester of Pregnancy  The third trimester of pregnancy is from week 28 through week 40. This is months 7 through 9. The third trimester is a time when your baby is growing fast. Body changes during your third trimester Your body continues to change during this time. The changes usually go away after your baby is born. Physical changes You will continue to gain weight. You may get stretch marks on your hips, belly, and breasts. Your breasts will keep growing and may hurt. A yellow fluid (colostrum) may leak from your breasts. This is the first milk you're making for your baby. Your hair may grow faster and get thicker. In some cases, you may get hair loss. Your belly button may stick out. You may have more swelling in your hands, face, or ankles. Health changes You may have heartburn. You may feel short of breath. This is caused by the uterus that is now bigger. You may have more aches in the pelvis, back, or thighs. You may have more tingling or numbness in your hands, arms, and legs. You may pee more often. You may have trouble pooping (constipation) or swollen veins in the butt that can itch or get painful (hemorrhoids). Other changes You may have more problems sleeping. You may notice the baby moving lower in your belly (dropping). You may have more fluid coming from your vagina. Your joints may feel loose, and you may have pain around your pelvic bone. Follow these instructions at home: Medicines Take medicines only as told by your health care provider. Some medicines are not safe during pregnancy. Your provider may change the medicines that you take. Do not take any medicines unless told to by your provider. Take a prenatal vitamin that has at least 600 micrograms (mcg) of folic acid. Do not use herbal medicines, illegal drugs, or medicines that are not approved by your provider. Eating and drinking While you're pregnant your body needs additional nutrition to help  support your growing baby. Talk with your provider about your nutritional needs. Activity Most women are able to exercise regularly during pregnancy. Exercise routines may need to change at the end of your pregnancy. Talk to your provider about your activities and exercise routine. Relieving pain and discomfort Rest often with your legs raised if you have leg cramps or low back pain. Take warm sitz baths to soothe pain from hemorrhoids. Use hemorrhoid cream if your provider says it's okay. Wear a good, supportive bra if your breasts hurt. Do not use hot tubs, steam rooms, or saunas. Do not douche. Do not use tampons or scented pads. Safety Talk to your provider before traveling far distances. Wear your seatbelt at all times when you're in a car. Talk to your provider if someone hits you, hurts you, or yells at you. Preparing for birth To prepare for your baby: Take childbirth and breastfeeding classes. Visit the hospital and tour the maternity area. Buy a rear-facing car seat. Learn how to install it in your car. General instructions Avoid cat litter boxes and soil used by cats. These things carry germs that can cause harm to your pregnancy and your baby. Do not drink alcohol, smoke, vape, or use products with nicotine or tobacco in them. If you need help quitting, talk with your provider. Keep all follow-up visits for your third trimester. Your provider will do more exams and tests during this trimester. Write down your questions. Take them to your prenatal visits. Your provider also will: Talk with you about  your overall health. Give you advice or refer you to specialists who can help with different needs, including: Mental health and counseling. Foods and healthy eating. Ask for help if you need help with food. Where to find more information American Pregnancy Association: americanpregnancy.org [employer]: acog.org Office on [employer] Health:  [employer] Contact a health care provider if: You have a headache that does not go away when you take medicine. You have any of these problems: You can't eat or drink. You have nausea and vomiting. You have watery poop (diarrhea) for 2 days or more. You have pain when you pee, or your pee smells bad. You have been sick for 2 days or more and aren't getting better. Contact your provider right away if: You have any of these coming from your vagina: Abnormal discharge. Bad-smelling fluid. Bleeding. Your baby is moving less than usual. You have signs of labor: You have any contractions, belly cramping, or have pain in your pelvis or lower back before 37 weeks of pregnancy (preterm labor). You have regular contractions that are less than 5 minutes apart. Your water breaks. You have symptoms of high blood pressure or preeclampsia. These include: A severe, throbbing headache that does not go away. Sudden or extreme swelling of your face, hands, legs, or feet. Vision problems: You see spots. You have blurry vision. Your eyes are sensitive to light. If you can't reach your provider, go to an urgent care or emergency room. Get help right away if: You faint, become confused, or can't think clearly. You have chest pain or trouble breathing. You have any kind of injury, such as from a fall or a car crash. These symptoms may be an emergency. Call 911 right away. Do not wait to see if the symptoms will go away. Do not drive yourself to the hospital. This information is not intended to replace advice given to you by your health care provider. Make sure you discuss any questions you have with your health care provider. Document Revised: 01/07/2023 Document Reviewed: 08/07/2022 Elsevier Patient Education  2024 [employer].

## 2024-01-14 ENCOUNTER — Other Ambulatory Visit (HOSPITAL_COMMUNITY)
Admission: RE | Admit: 2024-01-14 | Discharge: 2024-01-14 | Disposition: A | Discharge status: Home / Self Care | Source: Ambulatory Visit | Attending: Licensed Practical Nurse | Admitting: Licensed Practical Nurse

## 2024-01-14 ENCOUNTER — Ambulatory Visit (INDEPENDENT_AMBULATORY_CARE_PROVIDER_SITE_OTHER): Admitting: Licensed Practical Nurse

## 2024-01-14 ENCOUNTER — Encounter: Payer: Self-pay | Admitting: Licensed Practical Nurse

## 2024-01-14 VITALS — Wt 158.0 lb

## 2024-01-14 DIAGNOSIS — Z3A36 36 weeks gestation of pregnancy: Secondary | ICD-10-CM

## 2024-01-14 DIAGNOSIS — Z3480 Encounter for supervision of other normal pregnancy, unspecified trimester: Secondary | ICD-10-CM

## 2024-01-14 DIAGNOSIS — Z3483 Encounter for supervision of other normal pregnancy, third trimester: Secondary | ICD-10-CM

## 2024-01-14 DIAGNOSIS — Z113 Encounter for screening for infections with a predominantly sexual mode of transmission: Secondary | ICD-10-CM | POA: Insufficient documentation

## 2024-01-14 NOTE — Patient Instructions (Signed)
Pain Relief During Labor and Delivery Many things can cause pain during labor and delivery, including: Pressure due to the baby moving through the pelvis. Stretching of tissues due to the baby moving through the birth canal. Muscle tension due to anxiety or nervousness. The uterus tightening (contracting)and relaxing to help move the baby. How do I get pain relief during labor and delivery?  Discuss your pain relief options with your health care provider during your prenatal visits. Explore the options offered by your hospital or birth center. There are many ways to deal with the pain of labor and delivery. You can try relaxation techniques or doing relaxing activities, taking a warm shower or bath (hydrotherapy), or other methods. There are also many medicines available to help control pain. Relaxation techniques and activities Practice relaxation techniques or do relaxing activities, such as: Focused breathing. Meditation. Visualization. Aroma therapy. Listening to your favorite music. Hypnosis. Hydrotherapy Take a warm shower or bath. This may: Provide comfort and relaxation. Lessen your feeling of pain. Reduce the amount of pain medicine needed. Shorten the length of labor. Other methods Try doing other things, such as: Getting a massage or having counterpressure on your back. Applying warm packs or ice packs. Changing positions often, moving around, or using a birthing ball. Medicines You may be given: Pain medicine through an IV or an injection into a muscle. Pain medicine inserted into your spinal column. Injections of sterile water just under the skin on your lower back. Nitrous oxide inhalation therapy, also called laughing gas. What kinds of medicine are available for pain relief? There are two kinds of medicines that can be used to relieve pain during labor and delivery: Analgesics. These medicines decrease pain without causing you to lose feeling or the ability to move  your muscles. Anesthetics. These medicines block feeling in the body and can decrease your ability to move freely. Both kinds of medicine can cause minor side effects, such as nausea, trouble concentrating, and sleepiness. They can also affect the baby's heart rate before birth and his or her breathing after birth. For this reason, health care providers are careful about when and how much medicine is given. Which medicines are used to provide pain relief? Common medicines The most common medicines used to help manage pain during labor and delivery include: Opioids. Opioids are medicines that decrease how much pain you feel (perception of pain). These medicines can be given through an IV or may be used with anesthetics to block pain. Epidural analgesia. Epidural analgesia is given through a very thin tube that is inserted into the lower back. Medicine is delivered continuously to the area near your spinal column nerves (epidural space). After having this treatment, you may be able to move your legs, but you will not be able to walk. Depending on the amount and type of medicine given, you may lose all feeling in the lower half of your body, or you may have some sensation, including the urge to push. This treatment can be used to give pain relief for a vaginal birth. Sometimes, a numbing medicine is injected into the spinal fluid when an epidural catheter is placed. This provides for immediate relief but only lasts for 1-2 hours. Once it wears off, the epidural will provide pain relief. This is called a combined spinal-epidural (CSE) block. Intrathecal analgesia (spinal analgesia). Intrathecal analgesia is similar to epidural analgesia, but the medicine is injected into the spinal fluid instead of the epidural space. It is usually only given once.  It starts to relieve pain quickly, but the pain relief lasts only 1-2 hours. Pudendal block. This block is done by injecting numbing medicine through the wall of  the vagina and into a nerve in the pelvis. Other medicines Other medicines used to help manage pain during labor and delivery include: Local anesthetics. These are used to numb a small area of the body. They may be used along with another kind of medicine or used to numb the nerves of the vagina, cervix, and perineum during the second stage of labor. Spinal block (spinal anesthesia). Spinal anesthesia is similar to spinal analgesia, but the medicine that is used contains longer-acting numbing medicines and pain medicines. This type of anesthesia can be used for a cesarean delivery and allows you to stay awake for the birth of your baby. General anesthetics cause you to lose consciousness so you do not feel pain. They are usually only used for an emergency cesarean delivery. These medicines are given through an IV or a mask or both. These medicines are used as part of a procedure or for an emergency delivery. Summary Women have many options to help them manage the pain associated with labor and delivery. You can try doing relaxing activities, taking a warm shower or bath, or other methods. There are also many medicines available to help control pain during labor and delivery. Talk with your health care provider about what options are available to you. This information is not intended to replace advice given to you by your health care provider. Make sure you discuss any questions you have with your health care provider. Document Revised: 03/30/2022 Document Reviewed: 03/30/2022 Elsevier Patient Education  2024 [employer] Signs and Symptoms of Labor Labor is the body's natural process of moving the baby and the placenta out of the uterus. The process of labor usually starts when the baby is full-term, between 39 and 41 weeks of pregnancy. Signs and symptoms that you are close to going into labor As your body prepares for labor and the birth of your baby, you may notice the following symptoms in  the weeks and days before true labor starts: Passing a small amount of thick, bloody mucus from your vagina. This is called normal bloody show or losing your mucus plug. This may happen more than a week before labor begins, or right before labor begins, as the opening of the cervix starts to widen (dilate). For some women, the entire mucus plug passes at once. For others, pieces of the mucus plug may gradually pass over several days. Your baby moving (dropping) lower in your pelvis to get into position for birth (lightening). When this happens, you may feel more pressure on your bladder and pelvic bone and less pressure on your ribs. This may make it easier to breathe. It may also cause you to need to urinate more often and have problems with bowel movements. Having "practice contractions," also called Braxton Hicks contractions or false labor. These occur at irregular (unevenly spaced) intervals that are more than 10 minutes apart. False labor contractions are common after exercise or sexual activity. They will stop if you change position, rest, or drink fluids. These contractions are usually mild and do not get stronger over time. They may feel like: A backache or back pain. Mild cramps, similar to menstrual cramps. Tightening or pressure in your abdomen. Other early symptoms include: Nausea or loss of appetite. Diarrhea. Having a sudden burst of energy, or feeling very tired. Mood changes. Having trouble  sleeping. Signs and symptoms that labor has begun Signs that you are in labor may include: Having contractions that come at regular (evenly spaced) intervals and increase in intensity. This may feel like more intense tightening or pressure in your abdomen that moves to your back. Contractions may also feel like rhythmic pain in your upper thighs or back that comes and goes at regular intervals. If you are delivering for the first time, this change in intensity of contractions often occurs at a  more gradual pace. If you have given birth before, you may notice a more rapid progression of contraction changes. Feeling pressure in the vaginal area. Your water breaking (rupture of membranes). This is when the sac of fluid that surrounds your baby breaks. Fluid leaking from your vagina may be clear or blood-tinged. Labor usually starts within 24 hours of your water breaking, but it may take longer to begin. Some people may feel a sudden gush of fluid; others may notice repeatedly damp underwear. Follow these instructions at home:  When labor starts, or if your water breaks, call your health care provider or nurse care line. Based on your situation, they will determine when you should go in for an exam. During early labor, you may be able to rest and manage symptoms at home. Some strategies to try at home include: Breathing and relaxation techniques. Taking a warm bath or shower. Listening to music. Using a heating pad on the lower back for pain. If directed, apply heat to the area as often as told by your health care provider. Use the heat source that your health care provider recommends, such as a moist heat pack or a heating pad. Place a towel between your skin and the heat source. Leave the heat on for 20-30 minutes. Remove the heat if your skin turns bright red. This is especially important if you are unable to feel pain, heat, or cold. You have a greater risk of getting burned. Contact a health care provider if: Your labor has started. Your water breaks. You have nausea, vomiting, or diarrhea. Get help right away if: You have painful, regular contractions that are 5 minutes apart or less. Labor starts before you are [redacted] weeks along in your pregnancy. You have a fever. You have bright red blood coming from your vagina. You do not feel your baby moving. You have a severe headache with or without vision problems. You have chest pain or shortness of breath. These symptoms may  represent a serious problem that is an emergency. Do not wait to see if the symptoms will go away. Get medical help right away. Call your local emergency services (911 in the [place] Do not drive yourself to the hospital. Summary Labor is your body's natural process of moving your baby and the placenta out of your uterus. The process of labor usually starts when your baby is full-term, between 47 and 40 weeks of pregnancy. When labor starts, or if your water breaks, call your health care provider or nurse care line. Based on your situation, they will determine when you should go in for an exam. This information is not intended to replace advice given to you by your health care provider. Make sure you discuss any questions you have with your health care provider. Document Revised: 08/20/2020 Document Reviewed: 08/20/2020 Elsevier Patient Education  2024 [employer].

## 2024-01-14 NOTE — Assessment & Plan Note (Addendum)
-  TWG 14lbs, which a little low but ok, gained 3lbs since last visit  -gc/ct collected  -BSUS confirms vertex  -warning signs reviewed

## 2024-01-17 ENCOUNTER — Encounter: Payer: Self-pay | Admitting: Registered Nurse

## 2024-01-17 LAB — CERVICOVAGINAL ANCILLARY ONLY
Chlamydia: NEGATIVE
Comment: NEGATIVE
Comment: NORMAL
Neisseria Gonorrhea: NEGATIVE

## 2024-01-17 NOTE — Progress Notes (Unsigned)
    Return Prenatal Note   Subjective   28 y.o. H6E7997 at [redacted]w[redacted]d presents for this follow-up prenatal visit.  Patient *** Patient reports:    Objective   Flow sheet Vitals:   Total weight gain: 14 lb (6.35 kg)  General Appearance  No acute distress, well appearing, and well nourished Pulmonary   Normal work of breathing Neurologic   Alert and oriented to person, place, and time Psychiatric   Mood and affect within normal limits   Assessment/Plan   Plan  28 y.o. H6E7997 at [redacted]w[redacted]d presents for follow-up OB visit. Reviewed prenatal record including previous visit note.  GBS carrier -GBS pos in urine early in pregnancy. Clinda susceptible. -Hx amox allergy (hives), but not anaphylaxis. -Would be a candidate for Ancef.  Supervision of other normal pregnancy, antepartum - Reviewed labor warning signs and expectations for birth. Instructed to call office or come to hospital with persistent headache, vision changes, regular contractions, leaking of fluid, decreased fetal movement or vaginal bleeding.  - Continue weekly visits     No orders of the defined types were placed in this encounter.  No follow-ups on file.   Future Appointments  Date Time Provider Department Center  01/20/2024 11:15 AM Charma Domino, CNM [facility] None  01/28/2024  9:35 AM Dominic, Jinnie Jansky, CNM [facility] None    For next visit:  continue with routine prenatal care     Domino Charma, CNM  01/16/2510:58 AM

## 2024-01-17 NOTE — Assessment & Plan Note (Addendum)
-   Reviewed labor warning signs and expectations for birth. Instructed to call office or come to hospital with persistent headache, vision changes, regular contractions, leaking of fluid, decreased fetal movement or vaginal bleeding.  - Continue weekly visits

## 2024-01-17 NOTE — Assessment & Plan Note (Signed)
-  GBS pos in urine early in pregnancy. Clinda susceptible. -Hx amox allergy (hives), but not anaphylaxis. -Would be a candidate for Ancef.

## 2024-01-20 ENCOUNTER — Ambulatory Visit: Admitting: Registered Nurse

## 2024-01-20 DIAGNOSIS — Z3A37 37 weeks gestation of pregnancy: Secondary | ICD-10-CM

## 2024-01-20 DIAGNOSIS — Z3480 Encounter for supervision of other normal pregnancy, unspecified trimester: Secondary | ICD-10-CM

## 2024-01-20 DIAGNOSIS — Z22330 Carrier of Group B streptococcus: Secondary | ICD-10-CM

## 2024-01-28 ENCOUNTER — Encounter: Payer: Self-pay | Admitting: Licensed Practical Nurse

## 2024-01-28 ENCOUNTER — Ambulatory Visit (INDEPENDENT_AMBULATORY_CARE_PROVIDER_SITE_OTHER): Admitting: Licensed Practical Nurse

## 2024-01-28 VITALS — BP 119/79 | HR 86 | Wt 162.0 lb

## 2024-01-28 DIAGNOSIS — Z3483 Encounter for supervision of other normal pregnancy, third trimester: Secondary | ICD-10-CM

## 2024-01-28 DIAGNOSIS — Z3A38 38 weeks gestation of pregnancy: Secondary | ICD-10-CM | POA: Diagnosis not present

## 2024-01-28 DIAGNOSIS — Z3480 Encounter for supervision of other normal pregnancy, unspecified trimester: Secondary | ICD-10-CM

## 2024-01-28 NOTE — Progress Notes (Signed)
    Return Prenatal Note   Subjective   28 y.o. H6E7997 at [redacted]w[redacted]d presents for this follow-up prenatal visit.  Patient her with her son Patient reports:Doing well. No concerns today. Feels excited and ready to meet baby.  Movement: Present Contractions: Irritability  Objective   Flow sheet Vitals: Pulse Rate: 86 BP: 119/79 Fundal Height:  (36.5) Fetal Heart Rate (bpm): 140 Presentation: Vertex Dilation: 1 Effacement (%): 50 Station: -1 Total weight gain: 18 lb (8.165 kg)  General Appearance  No acute distress, well appearing, and well nourished Pulmonary   Normal work of breathing Neurologic   Alert and oriented to person, place, and time Psychiatric   Mood and affect within normal limits   Assessment/Plan   Plan  29 y.o. H6E7997 at [redacted]w[redacted]d presents for follow-up OB visit. Reviewed prenatal record including previous visit note.  Supervision of other normal pregnancy, antepartum -TWG 18, which is ok but on the low end -Initial fetal heart tone 160's then decreased to 140's and maintained 140's x 2 minutes, discussed most likely caught the fetus in an acceleration. -desires membrane sweep at next visit -warning signs reviewed       No orders of the defined types were placed in this encounter.  Return in about 1 week (around 02/04/2024) for ROB.   Future Appointments  Date Time Provider Department Center  02/04/2024  4:15 PM Justino Eleanor HERO, CNM [facility] None    For next visit:  continue with routine prenatal care     [facility], CNM  10/10/255:15 PM

## 2024-01-28 NOTE — Assessment & Plan Note (Signed)
-  TWG 18, which is ok but on the low end -Initial fetal heart tone 160's then decreased to 140's and maintained 140's x 2 minutes, discussed most likely caught the fetus in an acceleration. -desires membrane sweep at next visit -warning signs reviewed

## 2024-02-04 ENCOUNTER — Encounter: Payer: Self-pay | Admitting: Obstetrics

## 2024-02-04 ENCOUNTER — Ambulatory Visit (INDEPENDENT_AMBULATORY_CARE_PROVIDER_SITE_OTHER): Admitting: Obstetrics

## 2024-02-04 VITALS — BP 105/68 | HR 76 | Wt 157.0 lb

## 2024-02-04 DIAGNOSIS — Z3A39 39 weeks gestation of pregnancy: Secondary | ICD-10-CM | POA: Diagnosis not present

## 2024-02-04 DIAGNOSIS — Z3483 Encounter for supervision of other normal pregnancy, third trimester: Secondary | ICD-10-CM | POA: Diagnosis not present

## 2024-02-04 DIAGNOSIS — Z3480 Encounter for supervision of other normal pregnancy, unspecified trimester: Secondary | ICD-10-CM

## 2024-02-04 NOTE — Assessment & Plan Note (Addendum)
-  Membrane sweep performed: 2/50/-2 -PDIOL scheduled for 02/13/24 @ [facility] -Reviewed labor warning signs. Instructed to call office or come to hospital with persistent headache, vision changes, regular contractions, leaking of fluid, decreased fetal movement or vaginal bleeding.

## 2024-02-04 NOTE — Progress Notes (Signed)
    Return Prenatal Note   Assessment/Plan   Plan  28 y.o. G3P2002 at [redacted]w[redacted]d presents for follow-up OB visit. Reviewed prenatal record including previous visit note.  Supervision of other normal pregnancy, antepartum -Membrane sweep performed: 2/50/-2 -PDIOL scheduled for 02/13/24 @ [facility] -Reviewed labor warning signs. Instructed to call office or come to hospital with persistent headache, vision changes, regular contractions, leaking of fluid, decreased fetal movement or vaginal bleeding.     No orders of the defined types were placed in this encounter.  Return in about 1 week (around 02/11/2024).   Future Appointments  Date Time Provider Department Center  02/11/2024  9:15 AM [facility] RO[facility] None  02/11/2024  9:55 AM Slaughterbeck, Damien, CNM [facility] None    For next visit:  ROB with NST    Subjective   Mical is ready to meet her baby! She has had some runs of contractions and lost per mucus plug. She would like to schedule an IOL. Movement: Present Contractions: Not present  Objective   Flow sheet Vitals: Pulse Rate: 76 BP: 105/68 Fundal Height: 37 cm Fetal Heart Rate (bpm): 136 Presentation: Vertex Dilation: 2 Effacement (%): 50 Station: -2 Total weight gain: 13 lb (5.897 kg)  General Appearance  No acute distress, well appearing, and well nourished Pulmonary   Normal work of breathing Neurologic   Alert and oriented to person, place, and time Psychiatric   Mood and affect within normal limits  Eleanor Canny, CNM 02/04/24 5:06 PM

## 2024-02-10 NOTE — Progress Notes (Unsigned)
    Return Prenatal Note   Subjective   28 y.o. H6E7997 at [redacted]w[redacted]d presents for this follow-up prenatal visit.  Patient is doing good. She has no new concerns today. She reports good movement. Patient reports: Movement: Present Contractions: Not present  Objective   Flow sheet Vitals: Pulse Rate: 77 BP: 117/78 Fundal Height: 40 cm Fetal Heart Rate (bpm): 145 Presentation: Vertex (SVE) Dilation: 3 Effacement (%): 60 Station: -2 Total weight gain: 19 lb 14.4 oz (9.027 kg)  General Appearance  No acute distress, well appearing, and well nourished Pulmonary   Normal work of breathing Neurologic   Alert and oriented to person, place, and time Psychiatric   Mood and affect within normal limits  Krissy L Jamie 10/13/1995 [redacted]w[redacted]d  Fetus A Non-Stress Test Interpretation for 02/11/24  Indication: Postdates Pregnancy  Fetal Heart Rate A Baseline Rate (A): 145 bpm Variability: Moderate Accelerations: 15 x 15 Decelerations: None     Interpretation (Fetal Testing) Nonstress Test Interpretation: Reactive Overall Impression: Reassuring for gestational age     Assessment/Plan   Plan  28 y.o. H6E7997 at [redacted]w[redacted]d presents for follow-up OB visit. Reviewed prenatal record including previous visit note.  Supervision of other normal pregnancy, antepartum Very ready for baby! Able to sweep membrane today.  Reviewed what to do if she labors before the induction which is scheduled for Sunday. Reactive NST today Reviewed labor warning signs and expectations for birth. Instructed to call office or come to hospital with persistent headache, vision changes, regular contractions, leaking of fluid, decreased fetal movement or vaginal bleeding.       Orders Placed This Encounter  Procedures   Fetal nonstress test   No follow-ups on file.   No future appointments.   For next visit:  PDIOL scheduled for 02/13/24 @ [facility]      Damien Parsley, CNM [facility] 10/24/251:28 PM

## 2024-02-10 NOTE — Patient Instructions (Signed)
 Third Trimester of Pregnancy  The third trimester of pregnancy is from week 28 through week 40. This is months 7 through 9. The third trimester is a time when your baby is growing fast. Body changes during your third trimester Your body continues to change during this time. The changes usually go away after your baby is born. Physical changes You will continue to gain weight. You may get stretch marks on your hips, belly, and breasts. Your breasts will keep growing and may hurt. A yellow fluid (colostrum) may leak from your breasts. This is the first milk you're making for your baby. Your hair may grow faster and get thicker. In some cases, you may get hair loss. Your belly button may stick out. You may have more swelling in your hands, face, or ankles. Health changes You may have heartburn. You may feel short of breath. This is caused by the uterus that is now bigger. You may have more aches in the pelvis, back, or thighs. You may have more tingling or numbness in your hands, arms, and legs. You may pee more often. You may have trouble pooping (constipation) or swollen veins in the butt that can itch or get painful (hemorrhoids). Other changes You may have more problems sleeping. You may notice the baby moving lower in your belly (dropping). You may have more fluid coming from your vagina. Your joints may feel loose, and you may have pain around your pelvic bone. Follow these instructions at home: Medicines Take medicines only as told by your health care provider. Some medicines are not safe during pregnancy. Your provider may change the medicines that you take. Do not take any medicines unless told to by your provider. Take a prenatal vitamin that has at least 600 micrograms (mcg) of folic acid. Do not use herbal medicines, illegal drugs, or medicines that are not approved by your provider. Eating and drinking While you're pregnant your body needs additional nutrition to help  support your growing baby. Talk with your provider about your nutritional needs. Activity Most women are able to exercise regularly during pregnancy. Exercise routines may need to change at the end of your pregnancy. Talk to your provider about your activities and exercise routine. Relieving pain and discomfort Rest often with your legs raised if you have leg cramps or low back pain. Take warm sitz baths to soothe pain from hemorrhoids. Use hemorrhoid cream if your provider says it's okay. Wear a good, supportive bra if your breasts hurt. Do not use hot tubs, steam rooms, or saunas. Do not douche. Do not use tampons or scented pads. Safety Talk to your provider before traveling far distances. Wear your seatbelt at all times when you're in a car. Talk to your provider if someone hits you, hurts you, or yells at you. Preparing for birth To prepare for your baby: Take childbirth and breastfeeding classes. Visit the hospital and tour the maternity area. Buy a rear-facing car seat. Learn how to install it in your car. General instructions Avoid cat litter boxes and soil used by cats. These things carry germs that can cause harm to your pregnancy and your baby. Do not drink alcohol, smoke, vape, or use products with nicotine  or tobacco in them. If you need help quitting, talk with your provider. Keep all follow-up visits for your third trimester. Your provider will do more exams and tests during this trimester. Write down your questions. Take them to your prenatal visits. Your provider also will: Talk with you about  your overall health. Give you advice or refer you to specialists who can help with different needs, including: Mental health and counseling. Foods and healthy eating. Ask for help if you need help with food. Where to find more information American Pregnancy Association: americanpregnancy.org [employer]ologists: acog.org Office on [employer] Health:  [employer] Contact a health care provider if: You have a headache that does not go away when you take medicine. You have any of these problems: You can't eat or drink. You have nausea and vomiting. You have watery poop (diarrhea) for 2 days or more. You have pain when you pee, or your pee smells bad. You have been sick for 2 days or more and aren't getting better. Contact your provider right away if: You have any of these coming from your vagina: Abnormal discharge. Bad-smelling fluid. Bleeding. Your baby is moving less than usual. You have signs of labor: You have any contractions, belly cramping, or have pain in your pelvis or lower back before 37 weeks of pregnancy (preterm labor). You have regular contractions that are less than 5 minutes apart. Your water breaks. You have symptoms of high blood pressure or preeclampsia. These include: A severe, throbbing headache that does not go away. Sudden or extreme swelling of your face, hands, legs, or feet. Vision problems: You see spots. You have blurry vision. Your eyes are sensitive to light. If you can't reach your provider, go to an urgent care or emergency room. Get help right away if: You faint, become confused, or can't think clearly. You have chest pain or trouble breathing. You have any kind of injury, such as from a fall or a car crash. These symptoms may be an emergency. Call 911 right away. Do not wait to see if the symptoms will go away. Do not drive yourself to the hospital. This information is not intended to replace advice given to you by your health care provider. Make sure you discuss any questions you have with your health care provider. Document Revised: 01/07/2023 Document Reviewed: 08/07/2022 Elsevier Patient Education  2024 Elsevier Inc.Fetal Movement Counts When you're pregnant, you might start feeling your baby move around the middle of your pregnancy. At first, these movements might feel like  flutters, rolls, or swishes. As your baby grows, you might feel more kicks and jabs. Around week 28 of your pregnancy, your health care team may ask you to count how often your baby moves. This is important for all pregnancies, but especially for high-risk ones. Counting movements can help lessen the risk of stillbirth. What is a fetal movement count? A fetal movement count is the number of times that you feel your baby move during a certain amount of time. This may also be called a kick count. There are many ways to do a kick count. Ask your team what is best for you. Pay attention to when your baby is most active. You may notice your baby's sleep and wake cycles. You may also notice things that make your baby move more. When you do a kick count, try to do it: When your baby is normally most active. At the same time each day. How do I count fetal movements?  Find a quiet, comfortable area. Sit or lie down. Write down the date, the start time, and the number of movements you feel. Count kicks, flutters, swishes, rolls, and jabs. Usually, you will feel at least 10 movements within 2 hours. Stop counting after you have felt 10 movements  or if you have been counting for 2 hours. Write down the stop time. Contact a health care provider if: You don't feel 10 movements in 2 hours. Your baby isn't moving as it usually does. Your baby isn't moving at all. If you're not able to reach your provider, go to an emergency room. This information is not intended to replace advice given to you by your health care provider. Make sure you discuss any questions you have with your health care provider. Document Revised: 04/30/2023 Document Reviewed: 04/22/2022 Elsevier Patient Education  2025 [employer].

## 2024-02-11 ENCOUNTER — Encounter: Payer: Self-pay | Admitting: Obstetrics

## 2024-02-11 ENCOUNTER — Inpatient Hospital Stay
Admission: EM | Admit: 2024-02-11 | Discharge: 2024-02-13 | DRG: 807 | Disposition: A | Discharge status: Home / Self Care | Attending: Certified Nurse Midwife | Admitting: Certified Nurse Midwife

## 2024-02-11 ENCOUNTER — Encounter: Payer: Self-pay | Admitting: Certified Nurse Midwife

## 2024-02-11 ENCOUNTER — Other Ambulatory Visit: Payer: Self-pay

## 2024-02-11 ENCOUNTER — Ambulatory Visit (INDEPENDENT_AMBULATORY_CARE_PROVIDER_SITE_OTHER): Admitting: Certified Nurse Midwife

## 2024-02-11 ENCOUNTER — Other Ambulatory Visit

## 2024-02-11 VITALS — BP 117/78 | HR 77 | Wt 163.9 lb

## 2024-02-11 DIAGNOSIS — Z3A40 40 weeks gestation of pregnancy: Secondary | ICD-10-CM

## 2024-02-11 DIAGNOSIS — O480 Post-term pregnancy: Secondary | ICD-10-CM

## 2024-02-11 DIAGNOSIS — O99820 Streptococcus B carrier state complicating pregnancy: Secondary | ICD-10-CM

## 2024-02-11 DIAGNOSIS — O99824 Streptococcus B carrier state complicating childbirth: Secondary | ICD-10-CM | POA: Diagnosis present

## 2024-02-11 DIAGNOSIS — Z3480 Encounter for supervision of other normal pregnancy, unspecified trimester: Secondary | ICD-10-CM

## 2024-02-11 DIAGNOSIS — Z880 Allergy status to penicillin: Secondary | ICD-10-CM

## 2024-02-11 DIAGNOSIS — O26893 Other specified pregnancy related conditions, third trimester: Secondary | ICD-10-CM | POA: Diagnosis not present

## 2024-02-11 MED ORDER — MISOPROSTOL 50MCG HALF TABLET: 50.0000 ug | ORAL_TABLET | Freq: Once | ORAL | Status: DC

## 2024-02-11 MED ORDER — LACTATED RINGERS IV SOLN: INTRAVENOUS | Status: DC

## 2024-02-11 MED ORDER — LIDOCAINE HCL (PF) 1 % IJ SOLN: 30.0000 mL | INTRAMUSCULAR | Status: DC | PRN

## 2024-02-11 MED ORDER — MISOPROSTOL 25 MCG QUARTER TABLET: 25.0000 ug | ORAL_TABLET | Freq: Once | ORAL | Status: DC

## 2024-02-11 MED ORDER — FENTANYL CITRATE (PF) 100 MCG/2ML IJ SOLN: 50.0000 ug | INTRAMUSCULAR | Status: DC | PRN

## 2024-02-11 MED ORDER — CLINDAMYCIN PHOSPHATE 900 MG/50ML IV SOLN: 900.0000 mg | Freq: Three times a day (TID) | INTRAVENOUS | Status: DC

## 2024-02-11 MED ORDER — MISOPROSTOL 25 MCG QUARTER TABLET: 25.0000 ug | ORAL_TABLET | ORAL | Status: DC | PRN

## 2024-02-11 MED ORDER — ZOLPIDEM TARTRATE 5 MG PO TABS: 5.0000 mg | ORAL_TABLET | Freq: Every evening | ORAL | Status: DC | PRN

## 2024-02-11 MED ORDER — ACETAMINOPHEN 325 MG PO TABS
650.0000 mg | ORAL_TABLET | ORAL | Status: DC | PRN
  Administered 2024-02-12: 650 mg via ORAL
  Filled 2024-02-11: qty 2

## 2024-02-11 MED ORDER — IBUPROFEN 600 MG PO TABS
600.0000 mg | ORAL_TABLET | Freq: Four times a day (QID) | ORAL | Status: DC
  Administered 2024-02-12 – 2024-02-13 (×5): 600 mg via ORAL
  Filled 2024-02-11 (×5): qty 1

## 2024-02-11 MED ORDER — ONDANSETRON HCL 4 MG/2ML IJ SOLN: 4.0000 mg | Freq: Four times a day (QID) | INTRAMUSCULAR | Status: DC | PRN

## 2024-02-11 MED ORDER — LACTATED RINGERS IV SOLN: 500.0000 mL | INTRAVENOUS | Status: DC | PRN

## 2024-02-11 MED ORDER — SOD CITRATE-CITRIC ACID 500-334 MG/5ML PO SOLN: 30.0000 mL | ORAL | Status: DC | PRN

## 2024-02-11 MED ORDER — BENZOCAINE-MENTHOL 20-0.5 % EX AERO
1.0000 | INHALATION_SPRAY | CUTANEOUS | Status: DC | PRN
  Administered 2024-02-12: 1 via TOPICAL
  Filled 2024-02-11: qty 56

## 2024-02-11 MED ORDER — OXYTOCIN-SODIUM CHLORIDE 30-0.9 UT/500ML-% IV SOLN: 2.5000 [IU]/h | INTRAVENOUS | Status: DC

## 2024-02-11 MED ORDER — ACETAMINOPHEN 325 MG PO TABS
650.0000 mg | ORAL_TABLET | ORAL | Status: DC | PRN
Start: 2024-02-11 — End: 2024-02-11

## 2024-02-11 MED ORDER — OXYTOCIN-SODIUM CHLORIDE 30-0.9 UT/500ML-% IV SOLN: 1.0000 m[IU]/min | INTRAVENOUS | Status: DC

## 2024-02-11 MED ORDER — TERBUTALINE SULFATE 1 MG/ML IJ SOLN: 0.2500 mg | Freq: Once | INTRAMUSCULAR | Status: DC | PRN

## 2024-02-11 MED ORDER — OXYTOCIN 10 UNIT/ML IJ SOLN
10.0000 [IU] | INTRAMUSCULAR | Status: AC
Start: 2024-02-12 — End: 2024-02-11
  Administered 2024-02-11: 10 [IU] via INTRAMUSCULAR

## 2024-02-11 MED ORDER — HYDROXYZINE HCL 25 MG PO TABS: 50.0000 mg | ORAL_TABLET | Freq: Four times a day (QID) | ORAL | Status: DC | PRN

## 2024-02-11 MED ORDER — OXYTOCIN BOLUS FROM INFUSION: 333.0000 mL | Freq: Once | INTRAVENOUS | Status: DC

## 2024-02-11 MED ORDER — SIMETHICONE 80 MG PO CHEW: 80.0000 mg | CHEWABLE_TABLET | ORAL | Status: DC | PRN

## 2024-02-11 NOTE — H&P (Signed)
 [facility] Labor & Delivery  History and Physical  ASSESSMENT AND PLAN   Madison Lee is a 28 y.o. G3P2002 at [redacted]w[redacted]d with EDD: 02/05/2024, by Ultrasound admitted for labor.  1. Admit to L&D  2. EFM: Category 1 3. Admission labs  4. Delivery 5. Anticipate NSVD 6. MD notified of admission    Fetal Status: - cephalic presentation by SVE - EFW: 7 lbs by Leopold's - FHT currently category 1   Labs/Immunizations: Prenatal labs and studies: ABO, Rh: A/Positive/-- (04/07 1053) Antibody: Negative (04/07 1053) Rubella: 2.54 (04/07 1053) Varicella: Reactive (04/07 1053)  RPR: Non Reactive (07/28 1357)  HBsAg: Negative (04/07 1053)  HepC: Non Reactive (04/07 1053) HIV: Non Reactive (07/28 1357)  GBS:     TDAP: given prenatally Flu: yes RSV: yes  Postpartum Plan: - Feeding: Breast Milk - Contraception: plans Nuvaring - Prenatal Care Provider: [facility]    HPI   Chief Complaint: Contractions  Madison Lee is a 28 y.o. G3P2002 at [redacted]w[redacted]d who presents for contractions. Contractions started around 2130 and rapidly intensified. She was feeling pressure upon admission to L&D.  Pregnancy Complications Patient Active Problem List   Diagnosis Date Noted   Labor and delivery, indication for care 02/11/2024   GBS carrier 09/23/2023   Supervision of other normal pregnancy, antepartum 06/07/2023    Review of Systems A twelve point review of systems was negative except as stated in HPI.   HISTORY   Medications Medications Prior to Admission  Medication Sig Dispense Refill Last Dose/Taking   Prenatal Vit-Fe Fumarate-FA (MULTIVITAMIN-PRENATAL) 27-0.8 MG TABS tablet Take 1 tablet by mouth daily at 12 noon.       Allergies is allergic to amoxicillin.   OB History OB History  Gravida Para Term Preterm AB Living  3 2 2  0 0 2  SAB IAB Ectopic Multiple Live Births  0 0 0 0 2    # Outcome Date GA Lbr Len/2nd Weight Sex Type Anes PTL Lv  3 Current            2 Term 05/04/21 [redacted]w[redacted]d 05:10 3370 g M Vag-Spont EPI  LIV     Name: Cohrs,BOY Tomasita     Apgar1: 8  Apgar5: 9  1 Term 05/19/18 [redacted]w[redacted]d / 00:37 3930 g F Vag-Spont EPI  LIV     Name: LYME NELMA LUM     Apgar1: 8  Apgar5: 9    Past Medical History Past Medical History:  Diagnosis Date   Acute non-recurrent pansinusitis 10/12/2021   Asthma     Past Surgical History Past Surgical History:  Procedure Laterality Date   NO PAST SURGERIES      Social History  reports that she has never smoked. She has never used smokeless tobacco. She reports that she does not currently use alcohol. She reports that she does not use drugs.   Family History family history includes Bone cancer in an other family member; Breast cancer in an other family member; Cancer in her father; Colon cancer (age of onset: 55) in her paternal aunt; Heart disease in her paternal grandfather; Hyperthyroidism in her mother; Prostate cancer in an other family member.   PHYSICAL EXAM   There were no vitals filed for this visit.  Constitutional: No acute distress, well appearing, and well nourished. Neurologic: She is alert and conversational.  Psychiatric: She has a normal mood and affect.  Musculoskeletal: Normal gait, grossly normal range of motion Cardiovascular: Normal rate.   Pulmonary/Chest: Normal work  of breathing.  Gastrointestinal/Abdominal: Soft. Gravid. There is no tenderness.  Skin: Skin is warm and dry. No rash noted.  Genitourinary: Normal external female genitalia.  SVE:  7/100/+1   NST Interpretation   Baseline: 145 bpm Variability: moderate Accelerations: present Decelerations: none Contractions: irregular, every 2 minutes Impression: reactive    Eleanor CHRISTELLA Canny, CNM

## 2024-02-11 NOTE — Progress Notes (Signed)
 Patient Madison Lee presents to L&D for contractions that started at 5 pm. Reports contractions got worse through the night. Denies LOF but has bloody show.   SVE performed 8/100/0.   Gerren Hoffmeier C Chrystie Hagwood

## 2024-02-11 NOTE — Discharge Summary (Signed)
 Postpartum Discharge Summary  Date of Service updated 02/13/2024     Patient Name: Madison Lee DOB: December 15, 1995 MRN: [id number]  Date of admission: 02/11/2024 Delivery date:02/11/2024 Delivering provider: JUSTINO ELEANOR HERO Date of discharge: 02/13/2024  Admitting diagnosis: Labor and delivery, indication for care [O75.9] Intrauterine pregnancy: [redacted]w[redacted]d     Secondary diagnosis:  Active Problems:   Postpartum care following vaginal delivery   Vaginal delivery  Additional problems: none    Discharge diagnosis: Term Pregnancy Delivered                                              Post partum procedures:none Augmentation: N/A Complications: None  Hospital course: Onset of Labor With Vaginal Delivery      28 y.o. yo G3P3003 at [redacted]w[redacted]d was admitted in Active Labor on 02/11/2024. Labor course was uncomplicated  Membrane Rupture Time/Date: 10:50 PM,02/11/2024  Delivery Method:Vaginal, Spontaneous Operative Delivery:N/A Episiotomy: None Lacerations:  None Patient had a postpartum course uncomplicated .  She is ambulating, tolerating a regular diet, passing flatus, and urinating well. Patient is discharged home in stable condition on 02/13/24.  Newborn Data: Birth date:02/11/2024 Birth time:10:50 PM Gender:Female Living status:Living Apgars:8 ,9  Weight:3520 g  Magnesium Sulfate received: No BMZ received: No Rhophylac:N/A MMR:No T-DaP:Given prenatally Flu: given prenatally RSV Vaccine received: Yes Transfusion:No Immunizations administered: Immunization History  Administered Date(s) Administered    sv, Bivalent, Protein Subunit Rsvpref,pf [place]) 12/31/2023   Influenza, Seasonal, Injecte, Preservative Fre 12/31/2023   Influenza,inj,Quad PF,6+ Mos 12/30/2017, 12/30/2020   Tdap 03/11/2018, 03/03/2021, 11/15/2023    Physical exam  Vitals:   02/12/24 1644 02/12/24 2210 02/13/24 0043 02/13/24 0821  BP: 116/77 113/88 116/78 117/89  Pulse: 68 78 79 74  Resp: 18  18 18 20   Temp: 98.1 F (36.7 C) 98 F (36.7 C) 97.8 F (36.6 C) 98.3 F (36.8 C)  TempSrc: Oral Oral Oral Oral  SpO2:  98% 99% 100%  Weight:      Height:       General: alert, cooperative, and no distress Lochia: appropriate Uterine Fundus: firm Incision: N/A DVT Evaluation: No evidence of DVT seen on physical exam. No cords or calf tenderness. No significant calf/ankle edema. Labs: Lab Results  Component Value Date   WBC 12.8 (H) 02/12/2024   HGB 12.5 02/12/2024   HCT 36.1 02/12/2024   MCV 85.7 02/12/2024   PLT 182 02/12/2024       No data to display         Edinburgh Score:    02/12/2024    5:23 PM  Edinburgh Postnatal Depression Scale Screening Tool  I have been able to laugh and see the funny side of things. 0  I have looked forward with enjoyment to things. 0  I have blamed myself unnecessarily when things went wrong. 0  I have been anxious or worried for no good reason. 0  I have felt scared or panicky for no good reason. 0  Things have been getting on top of me. 0  I have been so unhappy that I have had difficulty sleeping. 0  I have felt sad or miserable. 0  I have been so unhappy that I have been crying. 0  The thought of harming myself has occurred to me. 0  Edinburgh Postnatal Depression Scale Total 0      After visit meds:  Allergies as of 02/13/2024       Reactions   Amoxicillin Hives        Medication List     TAKE these medications    multivitamin-prenatal 27-0.8 MG Tabs tablet Take 1 tablet by mouth daily at 12 noon.         Discharge home in stable condition Infant Feeding: Breast Infant Disposition:home with mother Discharge instruction: per After Visit Summary and Postpartum booklet. 6 weeks.  Diet: routine diet Anticipated Birth Control: IUD Postpartum Appointment:2 weeks Additional Postpartum F/U: Postpartum Depression checkup Future Appointments:No future appointments. Follow up Visit:  Follow-up Information      [facility] Follow up in 6 week(s).   Why: Call to schedule a 6 week Post-partum follow up visit. Contact information: [address] [place]  [address] [phone]                    02/13/2024 Zelda Hummer, CNM

## 2024-02-11 NOTE — Assessment & Plan Note (Signed)
 Very ready for baby! Able to sweep membrane today.  Reviewed what to do if she labors before the induction which is scheduled for Sunday. Reactive NST today Reviewed labor warning signs and expectations for birth. Instructed to call office or come to hospital with persistent headache, vision changes, regular contractions, leaking of fluid, decreased fetal movement or vaginal bleeding.

## 2024-02-12 ENCOUNTER — Encounter: Payer: Self-pay | Admitting: Obstetrics and Gynecology

## 2024-02-12 LAB — CBC
HCT: 36.1 % (ref 36.0–46.0)
Hemoglobin: 12.5 g/dL (ref 12.0–15.0)
MCH: 29.7 pg (ref 26.0–34.0)
MCHC: 34.6 g/dL (ref 30.0–36.0)
MCV: 85.7 fL (ref 80.0–100.0)
Platelets: 182 K/uL (ref 150–400)
RBC: 4.21 MIL/uL (ref 3.87–5.11)
RDW: 13 % (ref 11.5–15.5)
WBC: 12.8 K/uL — ABNORMAL HIGH (ref 4.0–10.5)
nRBC: 0 % (ref 0.0–0.2)

## 2024-02-12 LAB — RPR: RPR Ser Ql: NONREACTIVE

## 2024-02-12 MED ORDER — PRENATAL MULTIVITAMIN CH
1.0000 | ORAL_TABLET | Freq: Every day | ORAL | Status: DC
  Administered 2024-02-12: 1 via ORAL
  Filled 2024-02-12: qty 1

## 2024-02-12 MED ORDER — DOCUSATE SODIUM 100 MG PO CAPS
ORAL_CAPSULE | ORAL | Status: AC
  Administered 2024-02-12: 100 mg via ORAL
  Filled 2024-02-12: qty 1

## 2024-02-12 MED ORDER — COCONUT OIL OIL: 1.0000 | TOPICAL_OIL | Status: DC | PRN

## 2024-02-12 MED ORDER — DIPHENHYDRAMINE HCL 25 MG PO CAPS: 25.0000 mg | ORAL_CAPSULE | Freq: Four times a day (QID) | ORAL | Status: DC | PRN

## 2024-02-12 MED ORDER — METHYLERGONOVINE MALEATE 0.2 MG/ML IJ SOLN: 0.2000 mg | INTRAMUSCULAR | Status: DC | PRN

## 2024-02-12 MED ORDER — METHYLERGONOVINE MALEATE 0.2 MG PO TABS: 0.2000 mg | ORAL_TABLET | ORAL | Status: DC | PRN

## 2024-02-12 MED ORDER — OXYCODONE-ACETAMINOPHEN 5-325 MG PO TABS: 1.0000 | ORAL_TABLET | ORAL | Status: DC | PRN

## 2024-02-12 MED ORDER — DOCUSATE SODIUM 100 MG PO CAPS
100.0000 mg | ORAL_CAPSULE | Freq: Two times a day (BID) | ORAL | Status: DC
  Administered 2024-02-12 – 2024-02-13 (×3): 100 mg via ORAL
  Filled 2024-02-12 (×3): qty 1

## 2024-02-12 NOTE — Lactation Note (Signed)
 This note was copied from a baby's chart. Lactation Consultation Note  Patient Name: Madison Lee Today's Date: 02/12/2024 Age:27 hours    Lactation Note:  This is mom's 3rd baby, SVD. Mom is an experienced breastfeeding mother. Mom reports baby is latching and breastfeeding well with wet diapers as well as 9 stool diapers since birth. Per mom baby has started cluster feeding. Mom declines LC assistance at this time. Mom aware if she were to have a lactation question LC can return to bedside this evening or the [facility] can return tomorrow during the day. Mom reports she does have a Medela double pump for home use.  Consult Status  PRN In-patient 02/12/24  Update of LC's 5 minute face to face with mom provided to care nurse.  Madison Lee 02/12/2024, 8:03 PM

## 2024-02-12 NOTE — Discharge Instructions (Signed)

## 2024-02-12 NOTE — Progress Notes (Signed)
   Subjective:   Madison Lee had a NSVB on 02/11/2024. Her labor was uncomplicated. Has had routine postpartum care.  Eating, hydrating, and voiding regularly without difficulty. Since delivery has not had BM. She is breast feeding. Reports mild/moderate vaginal bleeding, denies passing large blood clots. Has had cramping abdomen pain relieved with tylenol /ibuprofen . Plans to use IUD for contraception. Denies anxiety/depression symptoms. Endorses good support from partner and family.   Objective:  Vital signs in last 24 hours: Temp:  [97.8 F (36.6 C)-98.2 F (36.8 C)] 98 F (36.7 C) (10/25 0753) Pulse Rate:  [62-92] 92 (10/25 0753) Resp:  [17-18] 18 (10/25 0753) BP: (109-127)/(51-83) 121/83 (10/25 0753) SpO2:  [97 %-100 %] 97 % (10/25 0753) Weight:  [73.9 kg-74.3 kg] 73.9 kg (10/24 2323)    General: NAD Pulmonary: no increased work of breathing Breasts: soft, non-tender, nipples without breakdown Abdomen: soft, non-tender Fundus: firm, midline, at umbilicus Lochia: light rubra, no clots Perineum: no erythema or foul odor discharge, minimal edema, laceration well approximated  Extremities: no edema, no erythema, no tenderness  Results for orders placed or performed during the hospital encounter of 02/11/24 (from the past 72 hours)  CBC     Status: Abnormal   Collection Time: 02/12/24  5:53 AM  Result Value Ref Range   WBC 12.8 (H) 4.0 - 10.5 K/uL   RBC 4.21 3.87 - 5.11 MIL/uL   Hemoglobin 12.5 12.0 - 15.0 g/dL   HCT 63.8 63.9 - 53.9 %   MCV 85.7 80.0 - 100.0 fL   MCH 29.7 26.0 - 34.0 pg   MCHC 34.6 30.0 - 36.0 g/dL   RDW 86.9 88.4 - 84.4 %   Platelets 182 150 - 400 K/uL   nRBC 0.0 0.0 - 0.2 %    Comment: Performed at [facility], [address], [place], [place] [address]     Assessment:   28 y.o. [phone] postpartum day 1 s/p spontaneous vaginal delivery on 02/11/2024 Breast Vital signs Reviewed and stable Pain well controlled  Plan:    PO  Fe Blood Type A/Positive/-- (04/07 1053) / Rubella 2.54 (04/07 1053) / Varicella Immune Rhogam: not indicated Tdap: Given prenatally Varicella:not indicated Rubella: not indicated Flu: Given prenatally Feeding plan breast, lactation support Encouraged to continue breastfeeding, BF education on latch, position changes, cluster feeding, hunger cues, lactogenesis II, milk supply Education given regarding options for contraception, as well as compatibility with breast feeding if applicable.  Patient plans on IUD for contraception. Continued routine postpartum care  Counseled on normal uterine involution and vaginal bleeding postpartum Anticipate discharge home tomorrow    Madison Lee, CNM [facility] 02/12/2024, 9:19 AM

## 2024-02-13 NOTE — Progress Notes (Signed)
Pt discharged with infant.  Discharge instructions, prescriptions and follow up appointment given to and reviewed with pt. Pt verbalized understanding. Escorted out by staff. 

## 2024-02-13 NOTE — Plan of Care (Signed)
  Problem: Pain Management: Goal: Relief or control of pain from uterine contractions will improve Outcome: Completed/Met   Problem: Safety: Goal: Risk of complications during labor and delivery will decrease Outcome: Completed/Met   Problem: Coping: Goal: Level of anxiety will decrease Outcome: Completed/Met

## 2024-03-03 ENCOUNTER — Telehealth: Admitting: Obstetrics

## 2024-03-08 ENCOUNTER — Telehealth: Admitting: Obstetrics

## 2024-03-08 ENCOUNTER — Encounter: Payer: Self-pay | Admitting: Obstetrics

## 2024-03-08 NOTE — Progress Notes (Signed)
   Virtual Visit via Video Note   I connected with Madison Lee  on 03/08/24 4:37 PM by a video enabled telemedicine application and verified that I am speaking with the correct person using two identifiers.   Location: Patient: home Provider: [facility]   I discussed the limitations of evaluation and management by telemedicine and the availability of in person appointments. The patient expressed understanding and agreed to proceed. Video visit did not connect on provider end. Telephone call made instead. Subjective:    Subjective  Madison Lee is a 28 y.o. female who presents for a postpartum visit. She is 2 weeks  postpartum following a spontaneous vaginal delivery. I have reviewed the prenatal and intrapartum course. The delivery was at [redacted]w[redacted]d .  Anesthesia: none.    Postpartum course has been normal. She has no pain and no other concerns. Baby's course has been normal. Baby is feeding by  breast. Bleeding is light. Bowel function is normal but she has hemorrhoids. Bladder function is normal.   Contraception method: plans IUD. Postpartum depression screening:   Edinburgh Postnatal Depression Scale - 03/08/24 1622       Edinburgh Postnatal Depression Scale:  In the Past 7 Days   I have been able to laugh and see the funny side of things. 0    I have looked forward with enjoyment to things. 0    I have blamed myself unnecessarily when things went wrong. 0    I have been anxious or worried for no good reason. 1    I have felt scared or panicky for no good reason. 0    Things have been getting on top of me. 1    I have been so unhappy that I have had difficulty sleeping. 0    I have felt sad or miserable. 0    I have been so unhappy that I have been crying. 0    The thought of harming myself has occurred to me. 0    Edinburgh Postnatal Depression Scale Total 2              Review of Systems Pertinent positives noted in HPI. Remainder of comprehensive ROS otherwise  negative.     Objective:      Objective [] Expand by Default There were no vitals taken for this visit.  General:  alert, cooperative, and no distress        Assessment:    Normal postpartum course, mood stable.   Plan:      Plan -Anticipatory guidance about the postpartum period -Reviewed s/s of PPD and PPA -Discussed when to seek medical care -Office visit at 6 weeks postpartum  I discussed the assessment and treatment plan with the patient. The patient was provided an opportunity to ask questions and all were answered. The patient agreed with the plan and demonstrated an understanding of the instructions.   The patient was advised to call back or seek an in-person evaluation if the symptoms worsen or if the condition fails to improve as anticipated.   I provided 10 minutes of non-face-to-face time during this encounter.     Kinesha Auten M Naidelyn Parrella, CNM

## 2024-03-20 ENCOUNTER — Ambulatory Visit: Admitting: Obstetrics

## 2024-03-20 ENCOUNTER — Encounter: Payer: Self-pay | Admitting: Obstetrics

## 2024-03-20 DIAGNOSIS — N8189 Other female genital prolapse: Secondary | ICD-10-CM | POA: Diagnosis not present

## 2024-03-20 DIAGNOSIS — Z3202 Encounter for pregnancy test, result negative: Secondary | ICD-10-CM | POA: Diagnosis not present

## 2024-03-20 DIAGNOSIS — Z30430 Encounter for insertion of intrauterine contraceptive device: Secondary | ICD-10-CM | POA: Diagnosis not present

## 2024-03-20 DIAGNOSIS — Z975 Presence of (intrauterine) contraceptive device: Secondary | ICD-10-CM | POA: Insufficient documentation

## 2024-03-20 LAB — POCT URINE PREGNANCY: Preg Test, Ur: NEGATIVE

## 2024-03-20 MED ORDER — LEVONORGESTREL 20 MCG/DAY IU IUD
1.0000 | INTRAUTERINE_SYSTEM | Freq: Once | INTRAUTERINE | Status: AC
  Administered 2024-03-20: 1 via INTRAUTERINE

## 2024-03-20 NOTE — Progress Notes (Signed)
 Post Partum Visit Note  Madison Lee is a 28 y.o. [id number] female who presents for a postpartum visit. She is 6 weeks postpartum following a normal spontaneous vaginal delivery.  I have reviewed the prenatal and intrapartum course. The delivery was at 40.6 gestational weeks.  Anesthesia: none. Postpartum course has been uncomplicated. Baby is doing well. Baby is feeding by breast. Bleeding is minimal. Bowel function is normal. Bladder function is normal. Patient is not sexually active. Contraception method: plans IUD. Postpartum depression screening: negative.   The pregnancy intention screening data noted above was reviewed. Potential methods of contraception were discussed. The patient elected to proceed with No data recorded.   Edinburgh Postnatal Depression Scale - 03/20/24 1104       Edinburgh Postnatal Depression Scale:  In the Past 7 Days   I have been able to laugh and see the funny side of things. 0    I have looked forward with enjoyment to things. 0    I have blamed myself unnecessarily when things went wrong. 0    I have been anxious or worried for no good reason. 0    I have felt scared or panicky for no good reason. 0    Things have been getting on top of me. 0    I have been so unhappy that I have had difficulty sleeping. 0    I have felt sad or miserable. 0    I have been so unhappy that I have been crying. 0    The thought of harming myself has occurred to me. 0    Edinburgh Postnatal Depression Scale Total 0          Health Maintenance Due  Topic Date Due   Hepatitis B Vaccines 19-59 Average Risk (1 of 3 - 19+ 3-dose series) Never done   HPV VACCINES (1 - 3-dose SCDM series) Never done   COVID-19 Vaccine (1 - 2025-26 season) Never done    The following portions of the patient's history were reviewed and updated as appropriate: allergies, current medications, and problem list.  Review of Systems Pertinent items are noted in HPI.  Objective:  BP  104/67   Pulse 70   Ht 5' 4 (1.626 m)   Wt 143 lb (64.9 kg)   Breastfeeding Yes   BMI 24.55 kg/m    General:  alert, cooperative, and appears stated age   Breasts:  not indicated  Lungs: clear to auscultation bilaterally  Heart:  regular rate and rhythm, S1, S2 normal, no murmur, click, rub or gallop  Abdomen: soft, non-tender; bowel sounds normal; no masses,  no organomegaly   Wound: N/A  GU exam:  normal         She desires reversible long-term contraception. We have thoroughly reviewed the risks, benefits, and alternatives, and she has elected to proceed with Mirena insertion.    UPT: negative  Pelvic exam: normal external genitalia, vulva, vagina, cervix, uterus and adnexa.   Procedure Note Consent was obtained prior to the procedure. A bimanual exam was performed to determine the position of the uterus. A sterile speculum was placed in the vagina, and the cervix was visualized. Betadine was applied to the cervix followed by 4% lidocaine . An Theresia was placed on the anterior lip of the cervix, and gentle traction was applied to straighten and stabilize it. The uterus was sounded to about 8 cm. The IUD was inserted to the appropriate depth and the insertion tool was removed. The  strings were trimmed to about 3 cm. Bleeding was minimal. Edla tolerated the procedure well. Post-procedure care and warning signs were reviewed with Niasia   Assessment:    1. Postpartum care and examination   2. Encounter for IUD insertion   Normal postpartum exam.   Plan:   Essential components of care per ACOG recommendations:  1.  Mood and well being: Patient with negative depression screening today. Reviewed local resources for support.  - Patient tobacco use? No.    2. Infant care and feeding:  -Patient currently breastmilk feeding? Yes  -Social determinants of health (SDOH) reviewed in EPIC. No concerns 3. Sexuality, contraception and birth spacing - Patient does not want a  pregnancy in the next year.  Desired family size is 3 children.  - Reviewed reproductive life planning. Reviewed contraceptive methods based on pt preferences and effectiveness.  Patient desired IUD or IUS today.   - Discussed birth spacing of 18 months  4. Sleep and fatigue -Encouraged family/partner/community support of 4 hrs of uninterrupted sleep to help with mood and fatigue  5. Physical Recovery  - Discussed patients delivery and complications. She describes her labor as good. - Patient had a NSVD. Patient had no lacterations. Perineal healing reviewed. Patient expressed understanding - Patient has urinary incontinence? No. - Patient is safe to resume physical and sexual activity  6.  Health Maintenance - HM due items addressed: Pap due 2027 - Last pap smear  Diagnosis  Date Value Ref Range Status  06/24/2022 (A)  Final   - Atypical squamous cells of undetermined significance (ASC-US )   Pap smear not done at today's visit.  -Breast Cancer screening indicated? No.   7. Chronic Disease/Pregnancy Condition follow up: None  - PCP follow up  Eleanor CHRISTELLA Canny, CNM [facility]

## 2024-04-07 DIAGNOSIS — R059 Cough, unspecified: Secondary | ICD-10-CM | POA: Diagnosis not present

## 2024-04-07 DIAGNOSIS — J0100 Acute maxillary sinusitis, unspecified: Secondary | ICD-10-CM | POA: Diagnosis not present
